# Patient Record
Sex: Female | Born: 1990 | Race: Black or African American | Hispanic: No | Marital: Married | Smoking: Former smoker
Health system: Southern US, Community
[De-identification: ages and names within clinical notes are randomized; demographics above are authoritative.]

## PROBLEM LIST (undated history)

## (undated) DIAGNOSIS — F32A Depression, unspecified: Secondary | ICD-10-CM

## (undated) DIAGNOSIS — T1490XA Injury, unspecified, initial encounter: Secondary | ICD-10-CM

## (undated) DIAGNOSIS — B977 Papillomavirus as the cause of diseases classified elsewhere: Secondary | ICD-10-CM

## (undated) DIAGNOSIS — F329 Major depressive disorder, single episode, unspecified: Secondary | ICD-10-CM

## (undated) DIAGNOSIS — F419 Anxiety disorder, unspecified: Secondary | ICD-10-CM

---

## 2012-01-23 ENCOUNTER — Emergency Department: Payer: Self-pay | Admitting: Emergency Medicine

## 2012-01-23 LAB — COMPREHENSIVE METABOLIC PANEL
Albumin: 4 g/dL (ref 3.8–5.6)
Alkaline Phosphatase: 67 U/L — ABNORMAL LOW (ref 82–169)
Anion Gap: 9 (ref 7–16)
Bilirubin,Total: 0.4 mg/dL (ref 0.2–1.0)
Calcium, Total: 8.9 mg/dL — ABNORMAL LOW (ref 9.0–10.7)
Chloride: 108 mmol/L — ABNORMAL HIGH (ref 98–107)
Co2: 25 mmol/L (ref 21–32)
EGFR (African American): >60
EGFR (Non-African Amer.): >60
Glucose: 88 mg/dL (ref 65–99)
Osmolality: 282 (ref 275–301)
SGPT (ALT): 20 U/L (ref 12–78)
Sodium: 142 mmol/L (ref 136–145)

## 2012-01-23 LAB — URINALYSIS, COMPLETE
Blood: NEGATIVE
Glucose,UR: NEGATIVE mg/dL (ref 0–75)
Leukocyte Esterase: NEGATIVE
Nitrite: NEGATIVE
Ph: 7 (ref 4.5–8.0)
Specific Gravity: 1.018 (ref 1.003–1.030)
WBC UR: 1 /HPF (ref 0–5)

## 2012-01-23 LAB — CBC
HCT: 39.1 % (ref 35.0–47.0)
HGB: 13.1 g/dL (ref 12.0–16.0)
MCH: 26.6 pg (ref 26.0–34.0)
MCHC: 33.4 g/dL (ref 32.0–36.0)
MCV: 80 fL (ref 80–100)
RDW: 16.8 % — ABNORMAL HIGH (ref 11.5–14.5)

## 2012-01-23 LAB — PREGNANCY, URINE: Pregnancy Test, Urine: NEGATIVE m[IU]/mL

## 2013-03-11 ENCOUNTER — Emergency Department: Payer: Self-pay | Admitting: Emergency Medicine

## 2013-03-11 LAB — RAPID INFLUENZA A&B ANTIGENS

## 2013-05-02 ENCOUNTER — Ambulatory Visit: Payer: Self-pay

## 2013-06-23 ENCOUNTER — Emergency Department: Payer: Self-pay | Admitting: Emergency Medicine

## 2013-06-23 LAB — URINALYSIS, COMPLETE
BILIRUBIN, UR: NEGATIVE
Bacteria: NONE SEEN
Blood: NEGATIVE
Glucose,UR: NEGATIVE mg/dL (ref 0–75)
KETONE: NEGATIVE
Leukocyte Esterase: NEGATIVE
Nitrite: NEGATIVE
PROTEIN: NEGATIVE
Ph: 8 (ref 4.5–8.0)
RBC,UR: <1 /HPF (ref 0–5)
Specific Gravity: 1.016 (ref 1.003–1.030)
WBC UR: <1 /HPF (ref 0–5)

## 2013-06-28 ENCOUNTER — Emergency Department: Payer: Self-pay | Admitting: Internal Medicine

## 2013-06-28 LAB — URINALYSIS, COMPLETE
BACTERIA: NONE SEEN
BLOOD: NEGATIVE
Bilirubin,UR: NEGATIVE
Glucose,UR: NEGATIVE mg/dL (ref 0–75)
LEUKOCYTE ESTERASE: NEGATIVE
Nitrite: NEGATIVE
Ph: 6 (ref 4.5–8.0)
Protein: 100
Specific Gravity: 1.034 (ref 1.003–1.030)

## 2013-06-28 LAB — COMPREHENSIVE METABOLIC PANEL
ALBUMIN: 3.9 g/dL (ref 3.4–5.0)
AST: 21 U/L (ref 15–37)
Alkaline Phosphatase: 61 U/L
Anion Gap: 8 (ref 7–16)
BILIRUBIN TOTAL: 0.5 mg/dL (ref 0.2–1.0)
BUN: 6 mg/dL — ABNORMAL LOW (ref 7–18)
CREATININE: 0.81 mg/dL (ref 0.60–1.30)
Calcium, Total: 8.8 mg/dL (ref 8.5–10.1)
Chloride: 104 mmol/L (ref 98–107)
Co2: 22 mmol/L (ref 21–32)
EGFR (African American): >60
GLUCOSE: 85 mg/dL (ref 65–99)
Osmolality: 265 (ref 275–301)
POTASSIUM: 3.2 mmol/L — AB (ref 3.5–5.1)
SGPT (ALT): 24 U/L (ref 12–78)
SODIUM: 134 mmol/L — AB (ref 136–145)
Total Protein: 7.7 g/dL (ref 6.4–8.2)

## 2013-06-28 LAB — CBC WITH DIFFERENTIAL/PLATELET
Basophil #: 0 10*3/uL (ref 0.0–0.1)
Basophil %: 0.2 %
EOS PCT: 0.3 %
Eosinophil #: 0 10*3/uL (ref 0.0–0.7)
HCT: 37 % (ref 35.0–47.0)
HGB: 11.9 g/dL — AB (ref 12.0–16.0)
Lymphocyte #: 2.7 10*3/uL (ref 1.0–3.6)
Lymphocyte %: 22.3 %
MCH: 25.6 pg — AB (ref 26.0–34.0)
MCHC: 32.2 g/dL (ref 32.0–36.0)
MCV: 79 fL — ABNORMAL LOW (ref 80–100)
MONO ABS: 1.1 x10 3/mm — AB (ref 0.2–0.9)
MONOS PCT: 9.5 %
Neutrophil #: 8.1 10*3/uL — ABNORMAL HIGH (ref 1.4–6.5)
Neutrophil %: 67.7 %
PLATELETS: 166 10*3/uL (ref 150–440)
RBC: 4.66 10*6/uL (ref 3.80–5.20)
RDW: 16.8 % — AB (ref 11.5–14.5)
WBC: 12 10*3/uL — ABNORMAL HIGH (ref 3.6–11.0)

## 2013-06-28 LAB — HCG, QUANTITATIVE, PREGNANCY: Beta Hcg, Quant.: 27858 m[IU]/mL — ABNORMAL HIGH

## 2013-07-19 ENCOUNTER — Ambulatory Visit: Payer: Self-pay | Admitting: Emergency Medicine

## 2013-07-19 LAB — URINALYSIS, COMPLETE
BACTERIA: NEGATIVE
Bilirubin,UR: NEGATIVE
Glucose,UR: NEGATIVE mg/dL (ref 0–75)
Nitrite: NEGATIVE
Ph: 8.5 (ref 4.5–8.0)
Specific Gravity: 1.005 (ref 1.003–1.030)

## 2013-07-19 LAB — CBC WITH DIFFERENTIAL/PLATELET
BASOS ABS: 0.1 10*3/uL (ref 0.0–0.1)
BASOS PCT: 1 %
EOS ABS: 0.3 10*3/uL (ref 0.0–0.7)
EOS PCT: 3.7 %
HCT: 38.1 % (ref 35.0–47.0)
HGB: 12.5 g/dL (ref 12.0–16.0)
LYMPHS ABS: 2.3 10*3/uL (ref 1.0–3.6)
Lymphocyte %: 31.8 %
MCH: 26.1 pg (ref 26.0–34.0)
MCHC: 32.8 g/dL (ref 32.0–36.0)
MCV: 80 fL (ref 80–100)
MONO ABS: 0.7 x10 3/mm (ref 0.2–0.9)
Monocyte %: 9.9 %
Neutrophil #: 3.8 10*3/uL (ref 1.4–6.5)
Neutrophil %: 53.6 %
Platelet: 200 10*3/uL (ref 150–440)
RBC: 4.79 10*6/uL (ref 3.80–5.20)
RDW: 16.2 % — AB (ref 11.5–14.5)
WBC: 7.1 10*3/uL (ref 3.6–11.0)

## 2013-07-19 LAB — GC/CHLAMYDIA PROBE AMP

## 2013-09-03 ENCOUNTER — Emergency Department: Payer: Self-pay | Admitting: Emergency Medicine

## 2013-10-11 ENCOUNTER — Observation Stay: Payer: Self-pay | Admitting: Obstetrics and Gynecology

## 2013-12-17 ENCOUNTER — Observation Stay: Payer: Self-pay

## 2013-12-17 LAB — URINALYSIS, COMPLETE
Bilirubin,UR: NEGATIVE
Blood: NEGATIVE
Glucose,UR: NEGATIVE mg/dL (ref 0–75)
Ketone: NEGATIVE
LEUKOCYTE ESTERASE: NEGATIVE
NITRITE: NEGATIVE
Ph: 6 (ref 4.5–8.0)
Protein: 30
SPECIFIC GRAVITY: 1.019 (ref 1.003–1.030)
Squamous Epithelial: 7
WBC UR: 1 /HPF (ref 0–5)

## 2014-01-03 ENCOUNTER — Ambulatory Visit: Payer: Self-pay

## 2014-04-23 ENCOUNTER — Emergency Department: Payer: Self-pay | Admitting: Emergency Medicine

## 2014-04-23 LAB — COMPREHENSIVE METABOLIC PANEL
ALBUMIN: 3.8 g/dL (ref 3.4–5.0)
ALK PHOS: 119 U/L — AB (ref 46–116)
AST: 28 U/L (ref 15–37)
Anion Gap: 8 (ref 7–16)
BUN: 10 mg/dL (ref 7–18)
Bilirubin,Total: 0.4 mg/dL (ref 0.2–1.0)
Calcium, Total: 9.3 mg/dL (ref 8.5–10.1)
Chloride: 105 mmol/L (ref 98–107)
Co2: 26 mmol/L (ref 21–32)
Creatinine: 1.06 mg/dL (ref 0.60–1.30)
EGFR (Non-African Amer.): >60
GLUCOSE: 114 mg/dL — AB (ref 65–99)
Osmolality: 277 (ref 275–301)
Potassium: 4 mmol/L (ref 3.5–5.1)
SGPT (ALT): 23 U/L (ref 14–63)
SODIUM: 139 mmol/L (ref 136–145)
Total Protein: 8.4 g/dL — ABNORMAL HIGH (ref 6.4–8.2)

## 2014-04-23 LAB — CBC WITH DIFFERENTIAL/PLATELET
Basophil #: 0 10*3/uL (ref 0.0–0.1)
Basophil %: 0.2 %
EOS ABS: 0.1 10*3/uL (ref 0.0–0.7)
Eosinophil %: 0.6 %
HCT: 38.8 % (ref 35.0–47.0)
HGB: 12.4 g/dL (ref 12.0–16.0)
LYMPHS ABS: 0.7 10*3/uL — AB (ref 1.0–3.6)
Lymphocyte %: 5.7 %
MCH: 25.1 pg — AB (ref 26.0–34.0)
MCHC: 31.9 g/dL — ABNORMAL LOW (ref 32.0–36.0)
MCV: 79 fL — AB (ref 80–100)
MONO ABS: 0.6 x10 3/mm (ref 0.2–0.9)
Monocyte %: 4.9 %
Neutrophil #: 10.6 10*3/uL — ABNORMAL HIGH (ref 1.4–6.5)
Neutrophil %: 88.6 %
PLATELETS: 241 10*3/uL (ref 150–440)
RBC: 4.94 10*6/uL (ref 3.80–5.20)
RDW: 17.4 % — AB (ref 11.5–14.5)
WBC: 12 10*3/uL — ABNORMAL HIGH (ref 3.6–11.0)

## 2014-04-23 LAB — URINALYSIS, COMPLETE
Bacteria: NONE SEEN
Bilirubin,UR: NEGATIVE
Blood: NEGATIVE
Glucose,UR: NEGATIVE mg/dL (ref 0–75)
Ketone: NEGATIVE
Leukocyte Esterase: NEGATIVE
Nitrite: NEGATIVE
PH: 6 (ref 4.5–8.0)
PROTEIN: NEGATIVE
RBC,UR: 1 /HPF (ref 0–5)
SPECIFIC GRAVITY: 1.021 (ref 1.003–1.030)
Squamous Epithelial: 3
WBC UR: 1 /HPF (ref 0–5)

## 2014-04-23 LAB — PREGNANCY, URINE: Pregnancy Test, Urine: NEGATIVE m[IU]/mL

## 2014-04-23 LAB — LIPASE, BLOOD: LIPASE: 127 U/L (ref 73–393)

## 2014-07-25 NOTE — H&P (Signed)
L&D Evaluation:  History:  HPI 24 year old G3P0110 at 2851w1d by stated EDC of 02/20/2014 presenting for evaluation of single episode of pink vaginal spotting.  No futher bleeding since that time, denies contractions, +FM, no LOF.  Patient states she has had an anatomy scan with no concerns expressed to her as to cervical length, or placental location.  Denies recent intercourse, vaginal checks, or TVUS preceeding this episode.   [facility] at [facility] in [place], thus far uncomplicated.  Her pregnancy history is noteable for prior 1st trimester pregnancy loss, followed by short interval pregnancy with 21 week loss.  No underlying cause was determined, patient spontaneously labored shortly after the IUFD was diagnosed.   Presents with vaginal bleeding   Patient's Medical History No Chronic Illness   Patient's Surgical History none   Medications Pre Natal Vitamins   Allergies NKDA   Social History none   Family History Non-Contributory   ROS:  ROS All systems were reviewed.  HEENT, CNS, GI, GU, Respiratory, CV, Renal and Musculoskeletal systems were found to be normal.   Exam:  Vital Signs stable   General no apparent distress   Mental Status no increased work of breathing   Abdomen gravid, non-tender   Estimated Fetal Weight Average for gestational age   Fetal Position Breech, anterior placenta well away from cervical os   Edema no edema   Pelvic no external lesions, cervix closed and thick   Mebranes Intact   FHT +FHT   Ucx absent   Impression:  Impression Vaginal spotting   Plan:  Comments 1) Vaginal spotting - no evidence of abruption, preterm labor or preterm cervical dilation      - reassurance and precautions given  2) Fetus - previable, confirmed FHTs good fetal movement on limited bedside ultrasound by myself  3) PNL - unavailable, patient lives in [place].  Should she represent to [facility] had her sign release of records particularly given her  complicated prior pregnancy history  4) Disposition - discharge home with follow up at her regular OB scheduled for 10/18/13   Follow Up Appointment already scheduled   Electronic Signatures: Lorrene ReidStaebler, Lavar Rosenzweig M (MD)  (Signed 28-Jul-15 21:43)  Authored: L&D Evaluation   Last Updated: 28-Jul-15 21:43 by Lorrene ReidStaebler, Sunya Humbarger M (MD)

## 2014-07-25 NOTE — H&P (Signed)
L&D Evaluation:  History:  HPI 24 year old G3P0110 at 530w5d by 227w1d US derived EDC of 02/20/2014 presenting for evaluation of sprapubic cramping.  Patient was at baby shower today in [place] today, has been in the car  alot.  [facility].  Drove back to [place] and then decided to come to [facility] for evalution of her symptoms.  Was just seen by her primary Ob/GYN yesterday.  No vaginal bleeding no LOF.  [facility]NC at [facility] in [place], thus far uncomplicated.  Her pregnancy history is noteable for prior 1st trimester pregnancy loss, followed by short interval pregnancy with 21 week loss.  No underlying cause was determined, patient spontaneously labored shortly after the missed abortion was diagnosed.  She presents here today because she states it was more convenient.   Presents with vaginal bleeding   Patient's Medical History Depression previously on prozac prior to pregnancy   Patient's Surgical History none   Medications Pre Natal Vitamins   Allergies NKDA   Social History none   Family History Non-Contributory   ROS:  ROS All systems were reviewed.  HEENT, CNS, GI, GU, Respiratory, CV, Renal and Musculoskeletal systems were found to be normal.   Exam:  Vital Signs stable   General no apparent distress   Mental Status no increased work of breathing   Abdomen gravid, non-tender   Estimated Fetal Weight Average for gestational age    Edema no edema   Pelvic no external lesions   FHT reactive NST   Ucx absent   Plan:  Comments 1) Cramping - no evidence of UTI, no evidence of contractions, reassurance  2) Fetus - reactive NST by <32 week criteria  3) PNL - O pos / ABSC neg / Hgb AA / RPR NR / RI / HBsAg neg / GC & CT neg & neg MSAFP neg   4) Disposition - last seen by primary OB/GYN on 10/2 to arrange sooner follow up if needed.  I discussed should the patient present here in preterm labor or labor we would not transfer her and she would  need to [facility].   Follow Up Appointment already scheduled   Electronic Signatures: Lorrene ReidStaebler, Frankee Gritz M (MD)  (Signed 03-Oct-15 22:17)  Authored: L&D Evaluation   Last Updated: 03-Oct-15 22:17 by Lorrene ReidStaebler, Kellene Mccleary M (MD)

## 2014-08-28 ENCOUNTER — Encounter: Payer: Self-pay | Admitting: Emergency Medicine

## 2014-08-28 ENCOUNTER — Ambulatory Visit
Admission: EM | Admit: 2014-08-28 | Discharge: 2014-08-28 | Disposition: A | Discharge status: Home / Self Care | Payer: Medicaid Other | Attending: Family Medicine | Admitting: Family Medicine

## 2014-08-28 DIAGNOSIS — R109 Unspecified abdominal pain: Secondary | ICD-10-CM | POA: Insufficient documentation

## 2014-08-28 DIAGNOSIS — R1030 Lower abdominal pain, unspecified: Secondary | ICD-10-CM

## 2014-08-28 DIAGNOSIS — R110 Nausea: Secondary | ICD-10-CM

## 2014-08-28 DIAGNOSIS — Z87891 Personal history of nicotine dependence: Secondary | ICD-10-CM | POA: Insufficient documentation

## 2014-08-28 LAB — URINALYSIS COMPLETE WITH MICROSCOPIC (ARMC ONLY)
GLUCOSE, UA: NEGATIVE mg/dL
HGB URINE DIPSTICK: NEGATIVE
Nitrite: NEGATIVE
PROTEIN: 30 mg/dL — AB
SPECIFIC GRAVITY, URINE: 1.02 (ref 1.005–1.030)
pH: 3 — ABNORMAL LOW (ref 5.0–8.0)

## 2014-08-28 LAB — CHLAMYDIA/NGC RT PCR (ARMC ONLY)
Chlamydia Tr: DETECTED
N gonorrhoeae: DETECTED

## 2014-08-28 MED ORDER — AZITHROMYCIN 500 MG PO TABS
1000.0000 mg | ORAL_TABLET | Freq: Once | ORAL | Status: AC
  Administered 2014-08-28: 1000 mg via ORAL

## 2014-08-28 MED ORDER — PROMETHAZINE HCL 25 MG/ML IJ SOLN
25.0000 mg | Freq: Once | INTRAMUSCULAR | Status: AC
  Administered 2014-08-28: 25 mg via INTRAMUSCULAR

## 2014-08-28 MED ORDER — ONDANSETRON 8 MG PO TBDP: 8.0000 mg | ORAL_TABLET | Freq: Two times a day (BID) | ORAL | Status: DC

## 2014-08-28 MED ORDER — ONDANSETRON 8 MG PO TBDP
8.0000 mg | ORAL_TABLET | Freq: Once | ORAL | Status: AC
  Administered 2014-08-28: 8 mg via ORAL

## 2014-08-28 NOTE — Discharge Instructions (Signed)
Recommend clear liquid diet and advance slowly as tolerated Over the counter zantac 75mg  2 tablets twice daily rx zofran as needed for nausea Follow up as needed if symptoms worsen or are not improving

## 2014-08-28 NOTE — ED Provider Notes (Signed)
CSN: [id number]     Arrival date & time 08/28/14  1022 History   First MD Initiated Contact with Patient 08/28/14 1109     Chief Complaint  Patient presents with  . Abdominal Pain   HPI   Patient presents with nausea, emesis 1, feeling off balance, tactile temps. She had the onset of low midline crampy abdominal discomfort yesterday. Some change in voiding, with small volumes and dribbling, onset yesterday. No vaginal discharge, just finished a menstrual period that was a little heavier and a little crampier than usual, but also shorter; started June 7. Patient is uncertain when last bowel movement was, has them infrequently. She is 6 months postpartum. She denies any chance of pregnancy, and is taking an oral contraceptive.  History reviewed. No pertinent past medical history. History reviewed. No pertinent past surgical history. History reviewed. No pertinent family history. History  Substance Use Topics  . Smoking status: Former Games developer  . Smokeless tobacco: Never Used  . Alcohol Use: No   OB History    No data available     Review of Systems  All other systems reviewed and are negative.   Allergies  Review of patient's allergies indicates no known allergies.  Home Medications   Prior to Admission medications   Medication Sig Start Date End Date Taking? Authorizing Provider  Norethindrone (CAMILA PO) Take 1 tablet by mouth daily.   Yes Historical Provider, MD   BP 102/61 mmHg  Pulse 98  Temp(Src) 99.5 F (37.5 C) (Oral)  Resp 18  Ht 5\' 3"  (1.6 m)  Wt 169 lb (76.658 kg)  BMI 29.94 kg/m2  SpO2 100%  LMP 08/22/2014 Physical Exam  Constitutional: She is oriented to person, place, and time.  Alert, nicely groomed Mild distress: Looks ill but not toxic. Huddled under blankets in the chair.  HENT:  Head: Atraumatic.  Eyes:  Conjugate gaze, no eye redness/drainage  Neck: Neck supple.  Cardiovascular: Normal rate and regular rhythm.   Pulmonary/Chest: No respiratory  distress. She has no wheezes. She has no rales.  Lungs clear, symmetric breath sounds  Abdominal: She exhibits no distension. There is no rebound and no guarding.  Mild suprapubic tenderness to deep palpation  Musculoskeletal: Normal range of motion.  No leg swelling No CVAT  Neurological: She is alert and oriented to person, place, and time.  Skin: Skin is warm and dry.  No cyanosis  Nursing note and vitals reviewed.   ED Course  Procedures (including critical care time) Labs Review Labs Reviewed  URINE CULTURE  URINALYSIS COMPLETEWITH MICROSCOPIC ([facility] ONLY)   UA unremarkable; urine cx pending.  Imaging Review No results found.   Zofran 8 mg by mouth given at the urgent care for nausea, to facilitate drinking fluids so that the patient could give a urine sample.  Orthostatic VS for the past 24 hrs:  BP- Lying Pulse- Lying BP- Sitting Pulse- Sitting BP- Standing at 0 minutes Pulse- Standing at 0 minutes  08/28/14 1157 121/63 mmHg 96 111/69 mmHg 103 116/70 mmHg 113      MDM  No diagnosis found.  Patient with low midline abd discomfort since yesterday, benign UA.  Differential dx includes pelvic infectious process, gastrointestinal process.  Signing care over to Dr Judd Gaudier.  Eustace Moore, MD 08/28/14 [phone]

## 2014-08-28 NOTE — ED Provider Notes (Signed)
CSN: [id number]     Arrival date & time 08/28/14  1022 History   First MD Initiated Contact with Patient 08/28/14 1109     Chief Complaint  Patient presents with  . Abdominal Pain   (Consider location/radiation/quality/duration/timing/severity/associated sxs/prior Treatment) Patient is a 24 y.o. female presenting with abdominal pain. The history is provided by the patient.  Abdominal Pain Pain location: mid lower abdomen. Pain quality: aching   Pain radiates to:  Does not radiate Pain severity:  Mild Onset quality:  Sudden Duration:  1 day Timing:  Constant Chronicity:  New Context: not alcohol use and not awakening from sleep   Relieved by:  Nothing Associated symptoms: nausea   Associated symptoms: no anorexia, no belching, no chest pain, no chills, no constipation, no cough, no diarrhea, no dysuria, no fatigue, no fever, no flatus, no hematemesis, no hematochezia, no hematuria, no melena, no shortness of breath, no sore throat, no vaginal bleeding, no vaginal discharge and no vomiting   Associated symptoms comment:  Sweats; constipation (no bowel movement for the last couple of days) Risk factors: no alcohol abuse, no aspirin use, not elderly, has not had multiple surgeries, no NSAID use, not pregnant and no recent hospitalization     History reviewed. No pertinent past medical history. History reviewed. No pertinent past surgical history. History reviewed. No pertinent family history. History  Substance Use Topics  . Smoking status: Former Games developer  . Smokeless tobacco: Never Used  . Alcohol Use: No   OB History    No data available     Review of Systems  Constitutional: Negative for fever, chills and fatigue.  HENT: Negative for sore throat.   Respiratory: Negative for cough and shortness of breath.   Cardiovascular: Negative for chest pain.  Gastrointestinal: Positive for nausea and abdominal pain. Negative for vomiting, diarrhea, constipation, melena, hematochezia,  anorexia, flatus and hematemesis.  Genitourinary: Negative for dysuria, hematuria, vaginal bleeding and vaginal discharge.    Allergies  Review of patient's allergies indicates no known allergies.  Home Medications   Prior to Admission medications   Medication Sig Start Date End Date Taking? Authorizing Provider  Norethindrone (CAMILA PO) Take 1 tablet by mouth daily.   Yes Historical Provider, MD  ondansetron (ZOFRAN ODT) 8 MG disintegrating tablet Take 1 tablet (8 mg total) by mouth 2 (two) times daily. 08/28/14   Payton Mccallum, MD   BP 102/61 mmHg  Pulse 98  Temp(Src) 99.5 F (37.5 C) (Oral)  Resp 18  Ht 5\' 3"  (1.6 m)  Wt 169 lb (76.658 kg)  BMI 29.94 kg/m2  SpO2 100%  LMP 08/22/2014 Physical Exam  Constitutional: She appears well-developed and well-nourished. No distress.  HENT:  Head: Normocephalic and atraumatic.  Cardiovascular: Normal rate and normal heart sounds.   Pulmonary/Chest: Effort normal and breath sounds normal. No respiratory distress. She has no wheezes. She has no rales. She exhibits no tenderness.  Abdominal: Soft. Bowel sounds are normal. She exhibits no distension and no mass. There is tenderness (mild mid lower abdomen; no rebound or guarding). There is no rebound and no guarding.  Neurological: She is alert.  Skin: No rash noted. She is not diaphoretic.  Vitals reviewed.   ED Course  Procedures (including critical care time) Labs Review Labs Reviewed  URINALYSIS COMPLETEWITH MICROSCOPIC ([facility] ONLY) - Abnormal; Notable for the following:    Color, Urine AMBER (*)    Bilirubin Urine TRACE (*)    Ketones, ur 2+ (*)    pH 3.0 (*)  Protein, ur 30 (*)    Leukocytes, UA 3+ (*)    Bacteria, UA FEW (*)    Squamous Epithelial / LPF 0-5 (*)    All other components within normal limits  URINE CULTURE  CHLAMYDIA/NGC RT PCR ([facility] ONLY)    Imaging Review No results found.   MDM    1. Lower abdominal pain   2. Nausea   (likely viral  syndrome; gastroenteritis)   New Prescriptions   ONDANSETRON (ZOFRAN ODT) 8 MG DISINTEGRATING TABLET    Take 1 tablet (8 mg total) by mouth 2 (two) times daily.   Plan: 1. Test results and diagnosis reviewed with patient 2. rx as per orders; risks, benefits, potential side effects reviewed with patient 3. Recommend supportive treatment with clear liquid diet for next 24 hours, then advance slowly as tolerated 4. Check urine culture and urine GC/chlamydia; patient will be notified of results/any further recommendations based on results 5. Patient given empiric treatment with zithromax 1gm po x1 5. F/u prn if symptoms worsen or don't improve    Payton Mccallum, MD 08/28/14 1349

## 2014-08-28 NOTE — ED Notes (Signed)
Upon discharging the patient she became extremely nauseated with dry heaves. Patient's grandmother is driving. Order for Phenergen 25 mg IM obtained and adm.

## 2014-08-28 NOTE — ED Notes (Signed)
Low abdominal pain that started yesterday. Has not had a bowel movement since pain started. Also having nausea, feeling cold, and night sweats. Has not checked her temp.

## 2014-08-29 ENCOUNTER — Ambulatory Visit
Admission: EM | Admit: 2014-08-29 | Discharge: 2014-08-29 | Disposition: A | Discharge status: Home / Self Care | Payer: Medicaid Other | Attending: Family Medicine | Admitting: Family Medicine

## 2014-08-29 ENCOUNTER — Telehealth: Payer: Self-pay | Admitting: Emergency Medicine

## 2014-08-29 DIAGNOSIS — A5400 Gonococcal infection of lower genitourinary tract, unspecified: Secondary | ICD-10-CM

## 2014-08-29 MED ORDER — CEFTRIAXONE SODIUM 250 MG IJ SOLR
250.0000 mg | Freq: Once | INTRAMUSCULAR | Status: AC
  Administered 2014-08-29: 250 mg via INTRAMUSCULAR

## 2014-08-29 NOTE — ED Notes (Signed)
Patient was notified that her test results came back positive for both Gonorrhea and Chlamydia.  Patient was instructed that she would need to come back to [facility] to receive an injection of Rocephin to treat her for the Gonorrhea.  Patient was already given 1000mg  of Azithromycin at her visit yesterday but did not receive Rochepin.  Patient verbalized understanding.  Patient states she will return to [facility] today for this.  The Communicable Disease form was faxed to the [employer] Department and received confirmation that the fax went through.

## 2014-08-29 NOTE — ED Notes (Addendum)
Patient here for nurse visit.  Patient was notified to come in today to receive a Rochephin injection for her positive result of Gonorrhea.  Patient states that she is feeling better from yesterday.

## 2014-08-30 LAB — URINE CULTURE

## 2016-04-20 ENCOUNTER — Ambulatory Visit: Payer: Medicaid Other

## 2016-04-20 ENCOUNTER — Ambulatory Visit
Admission: EM | Admit: 2016-04-20 | Discharge: 2016-04-20 | Disposition: A | Discharge status: Home / Self Care | Payer: Medicaid Other | Attending: Emergency Medicine | Admitting: Emergency Medicine

## 2016-04-20 DIAGNOSIS — M25532 Pain in left wrist: Secondary | ICD-10-CM | POA: Diagnosis not present

## 2016-04-20 DIAGNOSIS — S63502A Unspecified sprain of left wrist, initial encounter: Secondary | ICD-10-CM | POA: Diagnosis not present

## 2016-04-20 DIAGNOSIS — Z87891 Personal history of nicotine dependence: Secondary | ICD-10-CM | POA: Insufficient documentation

## 2016-04-20 MED ORDER — NAPROXEN 500 MG PO TABS: 500.0000 mg | ORAL_TABLET | Freq: Two times a day (BID) | ORAL | 0 refills | Status: DC

## 2016-04-20 NOTE — ED Triage Notes (Signed)
Patient complains of left wrist pain. Patient states that she has had no known injury to the area. Patient states that she has pain with any movement. Patient thought she was improving but has been getting worse.

## 2016-04-20 NOTE — ED Provider Notes (Signed)
CSN: [id number]     Arrival date & time 04/20/16  1451 History   First MD Initiated Contact with Patient 04/20/16 1614     Chief Complaint  Patient presents with  . Wrist Pain   (Consider location/radiation/quality/duration/timing/severity/associated sxs/prior Treatment) HPI  26 year old female who presents with left wrist pain. 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 Is a most her pain is of her volar wrist actually over the flexor tendons have pain radiated into her third fourth and fifth fingers. Any motion of her wrist is painful. Has been using a neoprene went but has not found it to be helpful. Is no pain in the elbow or shoulder.       History reviewed. No pertinent past medical history. History reviewed. No pertinent surgical history. History reviewed. No pertinent family history. Social History  Substance Use Topics  . Smoking status: Former Games developer  . Smokeless tobacco: Never Used  . Alcohol use No   OB History    No data available     Review of Systems  Constitutional: Positive for activity change.  Musculoskeletal: Positive for arthralgias and myalgias.  All other systems reviewed and are negative.   Allergies  Patient has no known allergies.  Home Medications   Prior to Admission medications   Medication Sig Start Date End Date Taking? Authorizing Provider  naproxen (NAPROSYN) 500 MG tablet Take 1 tablet (500 mg total) by mouth 2 (two) times daily with a meal. 04/20/16   Lutricia Feil, PA-C   Meds Ordered and Administered this Visit  Medications - No data to display  BP 116/63 (BP Location: Left Arm)   Pulse 71   Temp 98 F (36.7 C) (Oral)   Resp 18   Ht 5\' 3"  (1.6 m)   Wt 170 lb (77.1 kg)   SpO2 100%   BMI 30.11 kg/m  No data found.   Physical Exam  Constitutional: She is oriented to person, place, and time. She appears well-developed and  well-nourished. No distress.  HENT:  Head: Normocephalic and atraumatic.  Eyes: EOM are normal. Pupils are equal, round, and reactive to light. Right eye exhibits no discharge. Left eye exhibits no discharge.  Neck: Normal range of motion. Neck supple.  Musculoskeletal: She exhibits tenderness.  Examination of the nondominant left hand and wrist shows [place] motion of 10 to flexion tension lacks 10. Pronation and supination are full. Fingerss move well. The tendon show good pull-through FDS and FDP are strong. Extensor tendons are also strong and show good excursion. She is tender over the flexor tendons at the wrist. No swelling ecchymosis or erythema are present.  Neurological: She is alert and oriented to person, place, and time.  Skin: Skin is warm and dry. She is not diaphoretic.  Psychiatric: She has a normal mood and affect. Her behavior is normal. Judgment and thought content normal.  Nursing note and vitals reviewed.   Urgent Care Course     Procedures (including critical care time)  Labs Review Labs Reviewed - No data to display  Imaging Review Dg Wrist Complete Left  Result Date: 04/20/2016 CLINICAL DATA:  Volar left wrist pain for 10 days with no known injury. EXAM: LEFT WRIST - COMPLETE 3+ VIEW COMPARISON:  None. FINDINGS: There is no evidence of fracture or dislocation. There is no evidence of arthropathy or other focal bone abnormality. Soft tissues are unremarkable. IMPRESSION:  Negative. Electronically Signed   By: Marnee SpringJonathon  Watts M.D.   On: 04/20/2016 16:48     Visual Acuity Review  Right Eye Distance:   Left Eye Distance:   Bilateral Distance:    Right Eye Near:   Left Eye Near:    Bilateral Near:     A Velcro wrist splint was applied to the left wrist    MDM   1. Sprain of left wrist, initial encounter    Discharge Medication List as of 04/20/2016  5:02 PM    START taking these medications   Details  naproxen (NAPROSYN) 500 MG tablet Take 1 tablet (500  mg total) by mouth 2 (two) times daily with a meal., Starting Sun 04/20/2016, Print      Plan: 1. Test/x-ray results and diagnosis reviewed with patient 2. rx as per orders; risks, benefits, potential side effects reviewed with patient 3. Recommend supportive treatment with Rest and symptom avoidance. She should wear Wrist splint full time for 2 weeks and at nighttime for one month. Take Naprosyn daily with food. If she is not improving she should follow-up with an orthopedic surgeon for further evaluation and treatment 4. F/u prn if symptoms worsen or don't improve     Lutricia FeilWilliam P Deysha Cartier, PA-C 04/20/16 1711

## 2016-07-20 ENCOUNTER — Ambulatory Visit
Admission: EM | Admit: 2016-07-20 | Discharge: 2016-07-20 | Disposition: A | Discharge status: Home / Self Care | Payer: Medicaid Other | Attending: Family Medicine | Admitting: Family Medicine

## 2016-07-20 ENCOUNTER — Encounter: Payer: Self-pay | Admitting: *Deleted

## 2016-07-20 ENCOUNTER — Ambulatory Visit: Payer: Medicaid Other

## 2016-07-20 DIAGNOSIS — F17210 Nicotine dependence, cigarettes, uncomplicated: Secondary | ICD-10-CM | POA: Diagnosis not present

## 2016-07-20 DIAGNOSIS — R079 Chest pain, unspecified: Secondary | ICD-10-CM | POA: Diagnosis present

## 2016-07-20 DIAGNOSIS — Z79899 Other long term (current) drug therapy: Secondary | ICD-10-CM | POA: Insufficient documentation

## 2016-07-20 DIAGNOSIS — M940 Chondrocostal junction syndrome [Tietze]: Secondary | ICD-10-CM | POA: Insufficient documentation

## 2016-07-20 DIAGNOSIS — R0602 Shortness of breath: Secondary | ICD-10-CM | POA: Diagnosis not present

## 2016-07-20 DIAGNOSIS — R0789 Other chest pain: Secondary | ICD-10-CM | POA: Insufficient documentation

## 2016-07-20 DIAGNOSIS — R110 Nausea: Secondary | ICD-10-CM | POA: Insufficient documentation

## 2016-07-20 LAB — COMPREHENSIVE METABOLIC PANEL
ALT: 10 U/L — AB (ref 14–54)
AST: 16 U/L (ref 15–41)
Albumin: 4.1 g/dL (ref 3.5–5.0)
Alkaline Phosphatase: 65 U/L (ref 38–126)
Anion gap: 5 (ref 5–15)
BILIRUBIN TOTAL: 0.6 mg/dL (ref 0.3–1.2)
BUN: 6 mg/dL (ref 6–20)
CHLORIDE: 107 mmol/L (ref 101–111)
CO2: 25 mmol/L (ref 22–32)
CREATININE: 0.93 mg/dL (ref 0.44–1.00)
Calcium: 9.4 mg/dL (ref 8.9–10.3)
GFR calc Af Amer: >60 mL/min (ref >60)
GFR calc non Af Amer: >60 mL/min (ref >60)
Glucose, Bld: 98 mg/dL (ref 65–99)
Potassium: 3.8 mmol/L (ref 3.5–5.1)
Sodium: 137 mmol/L (ref 135–145)
Total Protein: 7.5 g/dL (ref 6.5–8.1)

## 2016-07-20 LAB — CBC WITH DIFFERENTIAL/PLATELET
Basophils Absolute: 0.1 10*3/uL (ref 0–0.1)
Basophils Relative: 1 %
Eosinophils Absolute: 0.4 10*3/uL (ref 0–0.7)
Eosinophils Relative: 5 %
HEMATOCRIT: 41.1 % (ref 35.0–47.0)
HEMOGLOBIN: 13.4 g/dL (ref 12.0–16.0)
LYMPHS ABS: 3.1 10*3/uL (ref 1.0–3.6)
LYMPHS PCT: 35 %
MCH: 25.4 pg — ABNORMAL LOW (ref 26.0–34.0)
MCHC: 32.6 g/dL (ref 32.0–36.0)
MCV: 77.9 fL — AB (ref 80.0–100.0)
Monocytes Absolute: 0.9 10*3/uL (ref 0.2–0.9)
Monocytes Relative: 10 %
Neutro Abs: 4.3 10*3/uL (ref 1.4–6.5)
Neutrophils Relative %: 49 %
Platelets: 186 10*3/uL (ref 150–440)
RBC: 5.28 MIL/uL — AB (ref 3.80–5.20)
RDW: 18.4 % — ABNORMAL HIGH (ref 11.5–14.5)
WBC: 8.8 10*3/uL (ref 3.6–11.0)

## 2016-07-20 LAB — FIBRIN DERIVATIVES D-DIMER (ARMC ONLY): Fibrin derivatives D-dimer (ARMC): 254.89 (ref 0.00–499.00)

## 2016-07-20 LAB — TROPONIN I: Troponin I: <0.03 ng/mL (ref <0.03)

## 2016-07-20 LAB — CK: Total CK: 108 U/L (ref 38–234)

## 2016-07-20 LAB — PREGNANCY, URINE: Preg Test, Ur: NEGATIVE

## 2016-07-20 LAB — CKMB (ARMC ONLY): CK, MB: 0.6 ng/mL (ref 0.5–5.0)

## 2016-07-20 MED ORDER — MELOXICAM 15 MG PO TABS
15.0000 mg | ORAL_TABLET | Freq: Every day | ORAL | 0 refills | Status: DC
Start: 2016-07-20 — End: 2016-09-06

## 2016-07-20 MED ORDER — KETOROLAC TROMETHAMINE 60 MG/2ML IM SOLN
60.0000 mg | Freq: Once | INTRAMUSCULAR | Status: AC
  Administered 2016-07-20: 60 mg via INTRAMUSCULAR

## 2016-07-20 MED ORDER — ONDANSETRON 8 MG PO TBDP: 8.0000 mg | ORAL_TABLET | Freq: Three times a day (TID) | ORAL | 0 refills | Status: DC | PRN

## 2016-07-20 MED ORDER — ONDANSETRON 8 MG PO TBDP
8.0000 mg | ORAL_TABLET | Freq: Once | ORAL | Status: AC
  Administered 2016-07-20: 8 mg via ORAL

## 2016-07-20 NOTE — ED Provider Notes (Signed)
[facility]    CSN: [id number] Arrival date & time: 07/20/16  1354     History   Chief Complaint Chief Complaint  Patient presents with  . Chest Pain  . Nausea    HPI Paula Green is a 26 y.o. female.   Patient is a 26 year old Afro-American female who started having chest pain Friday night. The chest pain started Friday night was after she experienced an episode of nausea and vomiting at work. Since then she's had progressive pain in her chest on the right side of her chest next to the sternum that radiates to her back. She's never had a stress test and EKG before. She she does smoke. There is no history of significant coronary artery disease in the family no sudden death history no history of hypertension. No previous surgeries or operations no known drug allergies. Unable to state equivocally that she is not pregnant. She still having some nausea and   The history is provided by the patient and a significant other. No language interpreter was used.  Chest Pain  Pain location:  Substernal area, R chest and R lateral chest Pain quality: aching, radiating and shooting   Pain radiates to:  Upper back Pain severity:  Moderate Onset quality:  Sudden Timing:  Constant Progression:  Worsening Chronicity:  New Context: lifting and movement   Relieved by:  Nothing Worsened by:  Coughing, deep breathing, movement and exertion Ineffective treatments:  None tried Associated symptoms: nausea, shortness of breath and vomiting     History reviewed. No pertinent past medical history.  There are no active problems to display for this patient.   History reviewed. No pertinent surgical history.  OB History    No data available       Home Medications    Prior to Admission medications   Medication Sig Start Date End Date Taking? Authorizing Provider  naproxen (NAPROSYN) 500 MG tablet Take 1 tablet (500 mg total) by mouth 2 (two) times daily with a meal. 04/20/16    Lutricia Feil, PA-C    Family History Family History  Problem Relation Age of Onset  . Cancer Mother     Social History Social History  Substance Use Topics  . Smoking status: Current Every Day Smoker    Packs/day: 0.50    Types: Cigarettes  . Smokeless tobacco: Never Used  . Alcohol use No     Allergies   Latex   Review of Systems Review of Systems  Respiratory: Positive for chest tightness and shortness of breath.   Cardiovascular: Positive for chest pain.  Gastrointestinal: Positive for nausea and vomiting.  All other systems reviewed and are negative.    Physical Exam Triage Vital Signs ED Triage Vitals  Enc Vitals Group     BP 07/20/16 1402 (!) 103/50     Pulse Rate 07/20/16 1402 65     Resp 07/20/16 1402 16     Temp 07/20/16 1402 98.5 F (36.9 C)     Temp Source 07/20/16 1402 Oral     SpO2 07/20/16 1402 100 %     Weight 07/20/16 1404 170 lb (77.1 kg)     Height 07/20/16 1404 5\' 3"  (1.6 m)     Head Circumference --      Peak Flow --      Pain Score 07/20/16 1405 6     Pain Loc --      Pain Edu? --      Excl. in GC? --  No data found.   Updated Vital Signs BP (!) 103/50 (BP Location: Right Arm)   Pulse 65   Temp 98.5 F (36.9 C) (Oral)   Resp 16   Ht 5\' 3"  (1.6 m)   Wt 170 lb (77.1 kg)   SpO2 100%   BMI 30.11 kg/m   Visual Acuity Right Eye Distance:   Left Eye Distance:   Bilateral Distance:    Right Eye Near:   Left Eye Near:    Bilateral Near:     Physical Exam  Constitutional: She is oriented to person, place, and time. She appears well-developed.  HENT:  Head: Normocephalic and atraumatic.  Right Ear: External ear normal.  Left Ear: External ear normal.  Nose: Nose normal.  Mouth/Throat: Oropharynx is clear and moist.  Eyes: Conjunctivae are normal. Pupils are equal, round, and reactive to light.  Neck: Normal range of motion. Neck supple. No tracheal deviation present. No thyromegaly present.  Cardiovascular: Normal  rate, regular rhythm and normal heart sounds.   Pulmonary/Chest: Effort normal and breath sounds normal. She exhibits tenderness. She exhibits no mass and no swelling. Bony tenderness: PALPATING THIS AREA DOES PRODUCE DISCOMFORT THAT SHE FELT.    Most the pain is over the anterior right chest wall lateral to the sternum  Abdominal: Soft. Bowel sounds are normal. She exhibits no distension.  Musculoskeletal: Normal range of motion. She exhibits no edema.  Lymphadenopathy:    She has no cervical adenopathy.  Neurological: She is alert and oriented to person, place, and time.  Skin: Skin is warm.  Psychiatric: She has a normal mood and affect.  Vitals reviewed.    UC Treatments / Results  Labs (all labs ordered are listed, but only abnormal results are displayed) Labs Reviewed  CBC WITH DIFFERENTIAL/PLATELET  TROPONIN I  COMPREHENSIVE METABOLIC PANEL  PREGNANCY, URINE  CK  CKMB([facility] ONLY)  FIBRIN DERIVATIVES D-DIMER ([facility] ONLY)    EKG  EKG Interpretation None      ED ECG REPORT I, Arcadio Cope H, the attending physician, personally viewed and interpreted this ECG.   Date: 07/20/2016  EKG Time: 14:13:32  Rate:61 Rhythm : There are no previous tracings available for comparison, normal sinus rhythm, Borderline right ventricular conduction delay  Axis:7  Intervals:nonspecific intraventricular conduction delay  ST&T Change: None Radiology No results found.  Procedures Procedures (including critical care time)  Medications Ordered in UC Medications  ondansetron (ZOFRAN-ODT) disintegrating tablet 8 mg (not administered)  ketorolac (TORADOL) injection 60 mg (not administered)      Results for orders placed or performed during the hospital encounter of 07/20/16  CBC with Differential  Result Value Ref Range   WBC 8.8 3.6 - 11.0 K/uL   RBC 5.28 (H) 3.80 - 5.20 MIL/uL   Hemoglobin 13.4 12.0 - 16.0 g/dL   HCT 86.541.1 78.435.0 - 69.647.0 %   MCV 77.9 (L) 80.0 - 100.0 fL   MCH  25.4 (L) 26.0 - 34.0 pg   MCHC 32.6 32.0 - 36.0 g/dL   RDW 29.518.4 (H) 28.411.5 - 13.214.5 %   Platelets 186 150 - 440 K/uL   Neutrophils Relative % 49 %   Neutro Abs 4.3 1.4 - 6.5 K/uL   Lymphocytes Relative 35 %   Lymphs Abs 3.1 1.0 - 3.6 K/uL   Monocytes Relative 10 %   Monocytes Absolute 0.9 0.2 - 0.9 K/uL   Eosinophils Relative 5 %   Eosinophils Absolute 0.4 0 - 0.7 K/uL   Basophils Relative 1 %  Basophils Absolute 0.1 0 - 0.1 K/uL  Troponin I  Result Value Ref Range   Troponin I <0.03 <0.03 ng/mL  Comprehensive metabolic panel  Result Value Ref Range   Sodium 137 135 - 145 mmol/L   Potassium 3.8 3.5 - 5.1 mmol/L   Chloride 107 101 - 111 mmol/L   CO2 25 22 - 32 mmol/L   Glucose, Bld 98 65 - 99 mg/dL   BUN 6 6 - 20 mg/dL   Creatinine, Ser 1.61 0.44 - 1.00 mg/dL   Calcium 9.4 8.9 - 09.6 mg/dL   Total Protein 7.5 6.5 - 8.1 g/dL   Albumin 4.1 3.5 - 5.0 g/dL   AST 16 15 - 41 U/L   ALT 10 (L) 14 - 54 U/L   Alkaline Phosphatase 65 38 - 126 U/L   Total Bilirubin 0.6 0.3 - 1.2 mg/dL   GFR calc non Af Amer >60 >60 mL/min   GFR calc Af Amer >60 >60 mL/min   Anion gap 5 5 - 15  Pregnancy, urine  Result Value Ref Range   Preg Test, Ur NEGATIVE NEGATIVE  CK  Result Value Ref Range   Total CK 108 38 - 234 U/L  CKMB([facility] only)  Result Value Ref Range   CK, MB 0.6 0.5 - 5.0 ng/mL  Fibrin derivatives D-Dimer  Result Value Ref Range   Fibrin derivatives D-dimer ([facility]) 254.89 0.00 - 499.00   Initial Impression / Assessment and Plan / UC Course  I have reviewed the triage vital signs and the nursing notes.  Pertinent labs & imaging results that were available during my care of the patient were reviewed by me and considered in my medical decision making (see chart for details).   it should be noted Toradol injection did help the pain. We'll place her on Mobic 13 mg 1 tablet day Zofran for nausea no for work for tomorrow follow-up PCP as needed the patient informed that she did have a  nonspecific intraventricular conduction delay on her EKG.   Final Clinical Impressions(s) / UC Diagnoses   Final diagnoses:  Nausea  Chest wall pain  Costal chondritis    New Prescriptions New Prescriptions   No medications on file     Note: This dictation was prepared with Dragon dictation along with smaller phrase technology. Any transcriptional errors that result from this process are unintentional.   Hassan Rowan, MD 07/20/16 [phone]

## 2016-07-20 NOTE — ED Triage Notes (Signed)
Patient started having right side chest pain with nausea and vomiting 2 days ago. Now previous history of chest pain.

## 2016-09-06 ENCOUNTER — Ambulatory Visit
Admission: EM | Admit: 2016-09-06 | Discharge: 2016-09-06 | Disposition: A | Discharge status: Home / Self Care | Payer: Medicaid Other

## 2016-09-06 ENCOUNTER — Encounter: Payer: Self-pay | Admitting: Gynecology

## 2016-09-06 DIAGNOSIS — F419 Anxiety disorder, unspecified: Secondary | ICD-10-CM

## 2016-09-06 DIAGNOSIS — R110 Nausea: Secondary | ICD-10-CM | POA: Diagnosis not present

## 2016-09-06 DIAGNOSIS — F332 Major depressive disorder, recurrent severe without psychotic features: Secondary | ICD-10-CM

## 2016-09-06 HISTORY — DX: Papillomavirus as the cause of diseases classified elsewhere: B97.7

## 2016-09-06 HISTORY — DX: Anxiety disorder, unspecified: F41.9

## 2016-09-06 HISTORY — DX: Depression, unspecified: F32.A

## 2016-09-06 HISTORY — DX: Injury, unspecified, initial encounter: T14.90XA

## 2016-09-06 HISTORY — DX: Major depressive disorder, single episode, unspecified: F32.9

## 2016-09-06 MED ORDER — ONDANSETRON HCL 8 MG PO TABS: 8.0000 mg | ORAL_TABLET | Freq: Three times a day (TID) | ORAL | 0 refills | Status: DC | PRN

## 2016-09-06 NOTE — ED Triage Notes (Signed)
Per patient nausea when having anxiety attack. Patient stated was seen here on 07/20/16 for her nausea and anxiety and was given Rx for nausea which help and would like refill.
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
## 2016-09-06 NOTE — Discharge Instructions (Signed)
Re[facility] go [facility] now for further evaluation.

## 2017-08-25 ENCOUNTER — Ambulatory Visit
Admission: EM | Admit: 2017-08-25 | Discharge: 2017-08-25 | Disposition: A | Discharge status: Home / Self Care | Payer: Self-pay | Attending: Family Medicine | Admitting: Family Medicine

## 2017-08-25 ENCOUNTER — Other Ambulatory Visit: Payer: Self-pay

## 2017-08-25 ENCOUNTER — Encounter: Payer: Self-pay | Admitting: Emergency Medicine

## 2017-08-25 DIAGNOSIS — R519 Headache, unspecified: Secondary | ICD-10-CM

## 2017-08-25 DIAGNOSIS — R112 Nausea with vomiting, unspecified: Secondary | ICD-10-CM

## 2017-08-25 DIAGNOSIS — R51 Headache: Secondary | ICD-10-CM

## 2017-08-25 LAB — RAPID STREP SCREEN (MED CTR MEBANE ONLY): STREPTOCOCCUS, GROUP A SCREEN (DIRECT): NEGATIVE

## 2017-08-25 MED ORDER — PROMETHAZINE HCL 25 MG PO TABS: 25.0000 mg | ORAL_TABLET | Freq: Three times a day (TID) | ORAL | 0 refills | Status: DC | PRN

## 2017-08-25 MED ORDER — CYCLOBENZAPRINE HCL 5 MG PO TABS: 5.0000 mg | ORAL_TABLET | Freq: Three times a day (TID) | ORAL | 0 refills | Status: DC | PRN
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
## 2017-08-25 NOTE — ED Triage Notes (Addendum)
Patient in today c/o dizziness, nausea, emesis, headache, sore throat, decreased appetite and "muscle spasms" x 3 days. Patient denies fever. Patient has tried OTC Aleve, sinus decongestant, Excedrin and Zofran without relief. Patient states she does have anxiety and is not seeing a therapist any more.

## 2017-08-25 NOTE — Discharge Instructions (Signed)
Fluids. Eat small meals.  Medication as needed for your symptoms.  Take care  Dr. Adriana Simasook

## 2017-08-28 LAB — CULTURE, GROUP A STREP (THRC)

## 2018-01-23 ENCOUNTER — Ambulatory Visit
Admission: EM | Admit: 2018-01-23 | Discharge: 2018-01-23 | Discharge status: Absent without leave | Payer: Medicaid Other

## 2018-12-28 ENCOUNTER — Other Ambulatory Visit: Payer: Self-pay

## 2018-12-28 ENCOUNTER — Emergency Department: Payer: Medicaid Other

## 2018-12-28 ENCOUNTER — Encounter: Payer: Self-pay | Admitting: Emergency Medicine

## 2018-12-28 ENCOUNTER — Emergency Department
Admission: EM | Admit: 2018-12-28 | Discharge: 2018-12-28 | Disposition: A | Discharge status: Home / Self Care | Payer: Medicaid Other | Attending: Emergency Medicine | Admitting: Emergency Medicine

## 2018-12-28 DIAGNOSIS — F410 Panic disorder [episodic paroxysmal anxiety] without agoraphobia: Secondary | ICD-10-CM | POA: Diagnosis not present

## 2018-12-28 DIAGNOSIS — F419 Anxiety disorder, unspecified: Secondary | ICD-10-CM | POA: Diagnosis not present

## 2018-12-28 DIAGNOSIS — Z79899 Other long term (current) drug therapy: Secondary | ICD-10-CM | POA: Diagnosis not present

## 2018-12-28 DIAGNOSIS — Z91040 Latex allergy status: Secondary | ICD-10-CM | POA: Diagnosis not present

## 2018-12-28 DIAGNOSIS — F17210 Nicotine dependence, cigarettes, uncomplicated: Secondary | ICD-10-CM | POA: Insufficient documentation

## 2018-12-28 DIAGNOSIS — R252 Cramp and spasm: Secondary | ICD-10-CM | POA: Insufficient documentation

## 2018-12-28 DIAGNOSIS — R519 Headache, unspecified: Secondary | ICD-10-CM | POA: Insufficient documentation

## 2018-12-28 LAB — COMPREHENSIVE METABOLIC PANEL
ALT: 15 U/L (ref 0–44)
AST: 18 U/L (ref 15–41)
Albumin: 4.2 g/dL (ref 3.5–5.0)
Alkaline Phosphatase: 54 U/L (ref 38–126)
Anion gap: 9 (ref 5–15)
BUN: <5 mg/dL — ABNORMAL LOW (ref 6–20)
CO2: 24 mmol/L (ref 22–32)
Calcium: 9.4 mg/dL (ref 8.9–10.3)
Chloride: 108 mmol/L (ref 98–111)
Creatinine, Ser: 0.95 mg/dL (ref 0.44–1.00)
GFR calc Af Amer: >60 mL/min (ref >60)
GFR calc non Af Amer: >60 mL/min (ref >60)
Glucose, Bld: 111 mg/dL — ABNORMAL HIGH (ref 70–99)
Potassium: 3.8 mmol/L (ref 3.5–5.1)
Sodium: 141 mmol/L (ref 135–145)
Total Bilirubin: 0.5 mg/dL (ref 0.3–1.2)
Total Protein: 7.2 g/dL (ref 6.5–8.1)

## 2018-12-28 LAB — CBC WITH DIFFERENTIAL/PLATELET
Abs Immature Granulocytes: 0.06 10*3/uL (ref 0.00–0.07)
Basophils Absolute: 0.1 10*3/uL (ref 0.0–0.1)
Basophils Relative: 1 %
Eosinophils Absolute: 0 10*3/uL (ref 0.0–0.5)
Eosinophils Relative: 0 %
HCT: 37.4 % (ref 36.0–46.0)
Hemoglobin: 12.4 g/dL (ref 12.0–15.0)
Immature Granulocytes: 1 %
Lymphocytes Relative: 15 %
Lymphs Abs: 1.8 10*3/uL (ref 0.7–4.0)
MCH: 26.6 pg (ref 26.0–34.0)
MCHC: 33.2 g/dL (ref 30.0–36.0)
MCV: 80.3 fL (ref 80.0–100.0)
Monocytes Absolute: 0.7 10*3/uL (ref 0.1–1.0)
Monocytes Relative: 6 %
Neutro Abs: 9.1 10*3/uL — ABNORMAL HIGH (ref 1.7–7.7)
Neutrophils Relative %: 77 %
Platelets: 218 10*3/uL (ref 150–400)
RBC: 4.66 MIL/uL (ref 3.87–5.11)
RDW: 18.3 % — ABNORMAL HIGH (ref 11.5–15.5)
Smear Review: NORMAL
WBC: 11.7 10*3/uL — ABNORMAL HIGH (ref 4.0–10.5)
nRBC: 0 % (ref 0.0–0.2)

## 2018-12-28 MED ORDER — ONDANSETRON 4 MG PO TBDP: 4.0000 mg | ORAL_TABLET | Freq: Three times a day (TID) | ORAL | 0 refills | Status: DC | PRN

## 2018-12-28 MED ORDER — ONDANSETRON 4 MG PO TBDP
4.0000 mg | ORAL_TABLET | Freq: Once | ORAL | Status: AC
  Administered 2018-12-28: 12:00:00 4 mg via ORAL
  Filled 2018-12-28: qty 1

## 2018-12-28 MED ORDER — ALPRAZOLAM 0.25 MG PO TABS: 0.2500 mg | ORAL_TABLET | Freq: Three times a day (TID) | ORAL | 0 refills | Status: DC | PRN
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
## 2018-12-28 NOTE — ED Triage Notes (Signed)
Pt in via [facility] from home, reports panic attack this morning, reports hx of same, denies any prescribed medication.  Pt complains of cramping to bilateral hands upon arrival, otherwise calm, cooperative, NAD noted at this time.

## 2018-12-28 NOTE — ED Notes (Signed)
Patient transported to CT 

## 2018-12-28 NOTE — Discharge Instructions (Addendum)
Follow-up with [facility].  You can call make an appointment or they have walk-in hours and a separate paper is given to you with this information.  Take the medication prescribed for you if needed for a panic attack.  There is a limited amount as this cannot be prescribed by emergency room for long-term use.

## 2018-12-28 NOTE — ED Notes (Signed)
Pt reports anxiety with panic attack.  States she has had same previously.  Pt calm and cooperative at this time.  Pt reports hands and feet tingling that is resolving now but still has cramping to same.

## 2018-12-28 NOTE — ED Notes (Signed)
Pt remains on cell phone throughout triage.

## 2019-01-10 ENCOUNTER — Ambulatory Visit
Admission: EM | Admit: 2019-01-10 | Discharge: 2019-01-10 | Disposition: A | Discharge status: Home / Self Care | Payer: Medicaid Other | Attending: Emergency Medicine | Admitting: Emergency Medicine

## 2019-01-10 ENCOUNTER — Other Ambulatory Visit: Payer: Self-pay

## 2019-01-10 DIAGNOSIS — N644 Mastodynia: Secondary | ICD-10-CM | POA: Diagnosis not present

## 2019-01-10 DIAGNOSIS — Z3201 Encounter for pregnancy test, result positive: Secondary | ICD-10-CM | POA: Insufficient documentation

## 2019-01-10 DIAGNOSIS — R112 Nausea with vomiting, unspecified: Secondary | ICD-10-CM

## 2019-01-10 LAB — PREGNANCY, URINE: Preg Test, Ur: POSITIVE — AB

## 2019-01-10 NOTE — ED Triage Notes (Signed)
Pt states that she took a home pregnancy test today and that it was positive. Pt states she wants another test here for confirmation. Pt alert and oriented X4, cooperative, RR even and unlabored, color WNL. Pt in NAD.
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
## 2019-01-10 NOTE — Discharge Instructions (Addendum)
Drink plenty of fluids.  No drugs, alcohol or smoking.  Start prenatal vitamins.  Recommend not using Xanax during pregnancy.  Follow-up with your OB/GYN at [facility] as planned.  Follow up with your primary care physician this week as needed. Return to Urgent care for new or worsening concerns.

## 2019-08-31 ENCOUNTER — Ambulatory Visit (INDEPENDENT_AMBULATORY_CARE_PROVIDER_SITE_OTHER): Payer: Medicaid Other

## 2019-08-31 ENCOUNTER — Ambulatory Visit
Admission: RE | Admit: 2019-08-31 | Discharge: 2019-08-31 | Disposition: A | Discharge status: Home / Self Care | Payer: Medicaid Other | Source: Ambulatory Visit

## 2019-08-31 ENCOUNTER — Other Ambulatory Visit: Payer: Self-pay

## 2019-08-31 VITALS — BP 103/84 | HR 101 | Temp 98.2°F | Resp 16

## 2019-08-31 DIAGNOSIS — S86911A Strain of unspecified muscle(s) and tendon(s) at lower leg level, right leg, initial encounter: Secondary | ICD-10-CM

## 2019-08-31 NOTE — Discharge Instructions (Signed)
Ice the area of pain for  20 minutes 3-4 times a day for 48 hours, then after that you may alternate with heat for a few more days  Take Ibuprofen 400 -600 mg every 8 hours for pain.   Follow up with your family Dr on Friday.

## 2019-08-31 NOTE — ED Triage Notes (Signed)
Pt states she tripped and fell down approx 8 stairs this AM, c/o right lower leg pain. Denies hitting head, no LOC. Ambulatory. No deformity.
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
## 2019-09-06 ENCOUNTER — Ambulatory Visit
Admission: EM | Admit: 2019-09-06 | Discharge: 2019-09-06 | Disposition: A | Discharge status: Home / Self Care | Payer: Medicaid Other | Attending: Family Medicine | Admitting: Family Medicine

## 2019-09-06 DIAGNOSIS — O219 Vomiting of pregnancy, unspecified: Secondary | ICD-10-CM | POA: Diagnosis present

## 2019-09-06 LAB — CBC WITH DIFFERENTIAL/PLATELET
Abs Immature Granulocytes: 0 10*3/uL (ref 0.00–0.07)
Basophils Absolute: 0.1 10*3/uL (ref 0.0–0.1)
Basophils Relative: 1 %
Eosinophils Absolute: 0.1 10*3/uL (ref 0.0–0.5)
Eosinophils Relative: 1 %
HCT: 38.9 % (ref 36.0–46.0)
Hemoglobin: 13 g/dL (ref 12.0–15.0)
Lymphocytes Relative: 38 %
Lymphs Abs: 3.3 10*3/uL (ref 0.7–4.0)
MCH: 26.5 pg (ref 26.0–34.0)
MCHC: 33.4 g/dL (ref 30.0–36.0)
MCV: 79.4 fL — ABNORMAL LOW (ref 80.0–100.0)
Monocytes Absolute: 0.6 10*3/uL (ref 0.1–1.0)
Monocytes Relative: 7 %
Neutro Abs: 4.6 10*3/uL (ref 1.7–7.7)
Neutrophils Relative %: 53 %
Platelets: 257 10*3/uL (ref 150–400)
RBC: 4.9 MIL/uL (ref 3.87–5.11)
RDW: 17.5 % — ABNORMAL HIGH (ref 11.5–15.5)
WBC: 8.6 10*3/uL (ref 4.0–10.5)
nRBC: 0 % (ref 0.0–0.2)

## 2019-09-06 LAB — URINALYSIS, COMPLETE (UACMP) WITH MICROSCOPIC
Bacteria, UA: NONE SEEN
Bilirubin Urine: NEGATIVE
Glucose, UA: NEGATIVE mg/dL
Hgb urine dipstick: NEGATIVE
Ketones, ur: NEGATIVE mg/dL
Leukocytes,Ua: NEGATIVE
Nitrite: NEGATIVE
Protein, ur: 30 mg/dL — AB
RBC / HPF: NONE SEEN RBC/hpf (ref 0–5)
Specific Gravity, Urine: 1.02 (ref 1.005–1.030)
WBC, UA: NONE SEEN WBC/hpf (ref 0–5)
pH: 8.5 — ABNORMAL HIGH (ref 5.0–8.0)

## 2019-09-06 LAB — COMPREHENSIVE METABOLIC PANEL
ALT: 18 U/L (ref 0–44)
AST: 20 U/L (ref 15–41)
Albumin: 4.4 g/dL (ref 3.5–5.0)
Alkaline Phosphatase: 53 U/L (ref 38–126)
Anion gap: 8 (ref 5–15)
BUN: <5 mg/dL — ABNORMAL LOW (ref 6–20)
CO2: 24 mmol/L (ref 22–32)
Calcium: 9.3 mg/dL (ref 8.9–10.3)
Chloride: 105 mmol/L (ref 98–111)
Creatinine, Ser: 0.93 mg/dL (ref 0.44–1.00)
GFR calc Af Amer: >60 mL/min (ref >60)
GFR calc non Af Amer: >60 mL/min (ref >60)
Glucose, Bld: 98 mg/dL (ref 70–99)
Potassium: 3.9 mmol/L (ref 3.5–5.1)
Sodium: 137 mmol/L (ref 135–145)
Total Bilirubin: 0.5 mg/dL (ref 0.3–1.2)
Total Protein: 7.8 g/dL (ref 6.5–8.1)

## 2019-09-06 LAB — PREGNANCY, URINE: Preg Test, Ur: POSITIVE — AB

## 2019-09-06 MED ORDER — ONDANSETRON 4 MG PO TBDP: 4.0000 mg | ORAL_TABLET | Freq: Once | ORAL | Status: DC

## 2019-09-06 MED ORDER — DOXYLAMINE-PYRIDOXINE 10-10 MG PO TBEC: DELAYED_RELEASE_TABLET | ORAL | 3 refills | Status: DC

## 2019-09-06 MED ORDER — ONDANSETRON 4 MG PO TBDP
4.0000 mg | ORAL_TABLET | Freq: Once | ORAL | Status: AC
  Administered 2019-09-06: 4 mg via ORAL

## 2019-09-06 NOTE — Discharge Instructions (Signed)
Medication as directed.  Lots of fluids.  Call OB GYN for an appt.  Take care  Dr. Adriana Simas

## 2019-09-06 NOTE — ED Triage Notes (Signed)
Pt reports having n/v/d x 4 days.

## 2019-09-06 NOTE — ED Provider Notes (Signed)
[facility]    CSN: [id number] Arrival date & time: 09/06/19  1093      History   Chief Complaint Chief Complaint  Patient presents with  . Nausea   HPI  29 year old female presents with nausea, vomiting, diarrhea.  3-4 day history of nausea, vomiting, diarrhea.  Patient reports that her menstrual cycles have not been normal either.  Patient reports that she had 1 day of bleeding on May 19.  Prior to that she had a normal menstrual cycle in April.  Patient reports that she is having a migraine as well.  She states that she was pulled over by a police officer this morning and was advised to come in for evaluation as she was very nauseous and sick.  She reports some associated abdominal pain.  No documented fever.  Patient states that she feels dehydrated.  She states that she tried to take a Zofran at home but could not tolerate and threw it up.  No relieving factors.   Home Medications    Prior to Admission medications   Medication Sig Start Date End Date Taking? Authorizing Provider  Doxylamine-Pyridoxine 10-10 MG TBEC 2 at bedtime on day 1 & 2; if symptoms persist, take 1 tablet in am and 2 tablets at bedtime on day 3; if symptoms persist, may increase to 1 tablet in am, 1 tablet afternoon, and 2 tablets at bedtime on day 4 09/06/19   Tommie Sams, DO  levonorgestrel (MIRENA) 20 MCG/24HR IUD 1 each by Intrauterine route once.  01/10/19  [provider]  promethazine (PHENERGAN) 25 MG tablet Take 1 tablet (25 mg total) by mouth every 8 (eight) hours as needed for nausea or vomiting. 08/25/17 01/10/19  Tommie Sams, DO    Family History Family History  Problem Relation Age of Onset  . Cancer Mother        lymphoma  . Healthy Father   . Ovarian cancer Maternal Grandmother   . Breast cancer Maternal Grandmother     Social History Social History   Tobacco Use  . Smoking status: Current Every Day Smoker    Packs/day: 0.50    Types: Cigarettes  .  Smokeless tobacco: Never Used  Vaping Use  . Vaping Use: Never used  Substance Use Topics  . Alcohol use: No  . Drug use: No     Allergies   Latex   Review of Systems Review of Systems  Constitutional: Positive for fatigue.  Gastrointestinal: Positive for diarrhea, nausea and vomiting.   Physical Exam Triage Vital Signs ED Triage Vitals  Enc Vitals Group     BP 09/06/19 0924 (!) 108/59     Pulse Rate 09/06/19 0924 74     Resp 09/06/19 0924 16     Temp 09/06/19 0924 98.4 F (36.9 C)     Temp Source 09/06/19 0924 Oral     SpO2 09/06/19 0924 100 %     Weight 09/06/19 0926 135 lb (61.2 kg)     Height 09/06/19 0926 5\' 2"  (1.575 m)     Head Circumference --      Peak Flow --      Pain Score 09/06/19 0925 3     Pain Loc --      Pain Edu? --      Excl. in GC? --    Updated Vital Signs BP (!) 108/59   Pulse 74   Temp 98.4 F (36.9 C) (Oral)   Resp 16  Ht 5\' 2"  (1.575 m)   Wt 61.2 kg   SpO2 100%   BMI 24.69 kg/m   Visual Acuity Right Eye Distance:   Left Eye Distance:   Bilateral Distance:    Right Eye Near:   Left Eye Near:    Bilateral Near:     Physical Exam Vitals and nursing note reviewed.  Constitutional:      General: She is not in acute distress.    Appearance: Normal appearance. She is not ill-appearing.  HENT:     Head: Normocephalic and atraumatic.     Mouth/Throat:     Comments: Slightly dry mucous membranes. Eyes:     General:        Right eye: No discharge.        Left eye: No discharge.     Conjunctiva/sclera: Conjunctivae normal.  Cardiovascular:     Rate and Rhythm: Normal rate and regular rhythm.     Heart sounds: No murmur heard.   Pulmonary:     Effort: Pulmonary effort is normal.     Breath sounds: Normal breath sounds. No wheezing, rhonchi or rales.  Abdominal:     General: There is no distension.     Palpations: Abdomen is soft.     Tenderness: There is no abdominal tenderness.  Neurological:     Mental Status: She is  alert.  Psychiatric:        Mood and Affect: Mood normal.        Behavior: Behavior normal.    UC Treatments / Results  Labs (all labs ordered are listed, but only abnormal results are displayed) Labs Reviewed  PREGNANCY, URINE - Abnormal; Notable for the following components:      Result Value   Preg Test, Ur POSITIVE (*)    All other components within normal limits  URINALYSIS, COMPLETE (UACMP) WITH MICROSCOPIC - Abnormal; Notable for the following components:   APPearance HAZY (*)    pH 8.5 (*)    Protein, ur 30 (*)    All other components within normal limits  CBC WITH DIFFERENTIAL/PLATELET - Abnormal; Notable for the following components:   MCV 79.4 (*)    RDW 17.5 (*)    All other components within normal limits  COMPREHENSIVE METABOLIC PANEL - Abnormal; Notable for the following components:   BUN <5 (*)    All other components within normal limits    EKG   Radiology No results found.  Procedures Procedures (including critical care time)  Medications Ordered in UC Medications  ondansetron (ZOFRAN-ODT) disintegrating tablet 4 mg (4 mg Oral Given 09/06/19 1019)    Initial Impression / Assessment and Plan / UC Course  I have reviewed the triage vital signs and the nursing notes.  Pertinent labs & imaging results that were available during my care of the patient were reviewed by me and considered in my medical decision making (see chart for details).    29 year old female presents for evaluation of nausea, vomiting, and diarrhea.  Labs obtained.  Laboratory studies reviewed.  CBC with no evident anemia.  Metabolic panel unremarkable.  Urine pregnancy test was positive.  Patient informed of positive result.  Patient states that she will follow-up with her OB/GYN's office.  Zofran given here.  Sending home on [place].  Advised close follow-up.  Supportive care.  Work note given.  Final Clinical Impressions(s) / UC Diagnoses   Final diagnoses:  Nausea and vomiting  during pregnancy     Discharge Instructions  Medication as directed.  Lots of fluids.  Call OB GYN for an appt.  Take care  Dr. Lacinda Axon     ED Prescriptions    Medication Sig Dispense Auth. Provider   Doxylamine-Pyridoxine 10-10 MG TBEC 2 at bedtime on day 1 & 2; if symptoms persist, take 1 tablet in am and 2 tablets at bedtime on day 3; if symptoms persist, may increase to 1 tablet in am, 1 tablet afternoon, and 2 tablets at bedtime on day 4 60 tablet Coral Spikes, DO     PDMP not reviewed this encounter.   Coral Spikes, DO 09/06/19 1100

## 2019-10-26 ENCOUNTER — Other Ambulatory Visit: Payer: Self-pay

## 2019-10-26 ENCOUNTER — Ambulatory Visit
Admission: EM | Admit: 2019-10-26 | Discharge: 2019-10-26 | Disposition: A | Discharge status: Home / Self Care | Payer: Medicaid Other | Attending: Family Medicine | Admitting: Family Medicine

## 2019-10-26 DIAGNOSIS — R300 Dysuria: Secondary | ICD-10-CM | POA: Insufficient documentation

## 2019-10-26 LAB — URINALYSIS, COMPLETE (UACMP) WITH MICROSCOPIC
Bilirubin Urine: NEGATIVE
Glucose, UA: NEGATIVE mg/dL
Ketones, ur: NEGATIVE mg/dL
Leukocytes,Ua: NEGATIVE
Nitrite: NEGATIVE
Protein, ur: NEGATIVE mg/dL
Specific Gravity, Urine: 1.015 (ref 1.005–1.030)
pH: 7 (ref 5.0–8.0)

## 2019-10-26 NOTE — ED Triage Notes (Signed)
Patient in today w/ c/o pain during urination, discomfort in pelvis w/ throbbing. Patient is approx. [redacted] weeks pregnant.

## 2019-10-26 NOTE — Discharge Instructions (Addendum)
No evidence of UTI.  Lots of fluids.  If persists or worsens, go to the hospital.  Take care  Dr. Adriana Simas
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
## 2020-11-22 ENCOUNTER — Other Ambulatory Visit: Payer: Self-pay

## 2020-11-22 ENCOUNTER — Ambulatory Visit (INDEPENDENT_AMBULATORY_CARE_PROVIDER_SITE_OTHER): Payer: Medicaid Other

## 2020-11-22 ENCOUNTER — Ambulatory Visit
Admission: EM | Admit: 2020-11-22 | Discharge: 2020-11-22 | Disposition: A | Discharge status: Home / Self Care | Payer: Medicaid Other | Attending: Physician Assistant | Admitting: Physician Assistant

## 2020-11-22 ENCOUNTER — Ambulatory Visit: Admission: EM | Admit: 2020-11-22 | Payer: Self-pay

## 2020-11-22 DIAGNOSIS — M25531 Pain in right wrist: Secondary | ICD-10-CM

## 2020-11-22 DIAGNOSIS — S63501A Unspecified sprain of right wrist, initial encounter: Secondary | ICD-10-CM

## 2020-11-22 MED ORDER — IBUPROFEN 800 MG PO TABS: 800.0000 mg | ORAL_TABLET | Freq: Three times a day (TID) | ORAL | 0 refills | Status: AC | PRN

## 2020-11-22 NOTE — ED Triage Notes (Signed)
Pt here with C/O right wrist pain for 3 days, after carrying a piece of heavy furniture alone. Has been wrapping it with ACE wrap and a brace she has at home.

## 2020-11-22 NOTE — Discharge Instructions (Addendum)
SPRAIN: X-ray is normal. Stressed avoiding painful activities . Reviewed RICE guidelines. Use medications as directed, including NSAIDs. If no NSAIDs have been prescribed for you today, you may take Aleve or Motrin over the counter. May use Tylenol in between doses of NSAIDs.  If no improvement in the next 1-2 weeks, f/u with PCP or return to our office for reexamination, and please feel free to call or return at any time for any questions or concerns you may have and we will be happy to help you!
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
## 2020-12-22 ENCOUNTER — Ambulatory Visit
Admission: EM | Admit: 2020-12-22 | Discharge: 2020-12-22 | Disposition: A | Discharge status: Home / Self Care | Payer: Medicaid Other | Attending: Emergency Medicine | Admitting: Emergency Medicine

## 2020-12-22 ENCOUNTER — Ambulatory Visit (INDEPENDENT_AMBULATORY_CARE_PROVIDER_SITE_OTHER): Payer: Medicaid Other

## 2020-12-22 DIAGNOSIS — M79672 Pain in left foot: Secondary | ICD-10-CM | POA: Diagnosis not present

## 2020-12-22 MED ORDER — IBUPROFEN 600 MG PO TABS
600.0000 mg | ORAL_TABLET | Freq: Four times a day (QID) | ORAL | 0 refills | Status: DC | PRN
Start: 2020-12-22 — End: 2022-10-20

## 2020-12-22 MED ORDER — TRAMADOL HCL 50 MG PO TABS: 50.0000 mg | ORAL_TABLET | Freq: Four times a day (QID) | ORAL | 0 refills | Status: DC | PRN
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
## 2020-12-22 NOTE — Discharge Instructions (Addendum)
Your x-rays today did not reveal any fractures.  It is not clear as to what is causing your foot pain.  Take the ibuprofen 6 and milligrams every 6 hours with food as needed for mild to moderate pain.  Use the tramadol every 6 hours as needed for severe pain.  Do not take it with alcohol and do not drive if you take it as it make you drowsy.  You may want to try wearing shoes with a more narrow toe box as your sneakers may be putting pressure on the outside edge of your foot leading to the pain.  Ice your foot for torments at a time 2-3 times a day to help decrease inflammation which may be causing the pain.  Elevate your foot is much as possible to decrease inflammation as well.  If your symptoms do not improve I recommend following up with podiatry.

## 2020-12-22 NOTE — ED Triage Notes (Signed)
Pt c/o cramps to her left foot for several days, pt states now she can barely walk on the foot. Pt denies any known injury. Pt denies any other muscle cramping.

## 2022-08-21 DIAGNOSIS — Z113 Encounter for screening for infections with a predominantly sexual mode of transmission: Secondary | ICD-10-CM | POA: Diagnosis not present

## 2022-08-21 DIAGNOSIS — F32A Depression, unspecified: Secondary | ICD-10-CM | POA: Diagnosis not present

## 2022-08-21 DIAGNOSIS — Z0001 Encounter for general adult medical examination with abnormal findings: Secondary | ICD-10-CM | POA: Diagnosis not present

## 2022-08-21 DIAGNOSIS — N6324 Unspecified lump in the left breast, lower inner quadrant: Secondary | ICD-10-CM | POA: Diagnosis not present

## 2022-08-21 DIAGNOSIS — Z01419 Encounter for gynecological examination (general) (routine) without abnormal findings: Secondary | ICD-10-CM | POA: Diagnosis not present

## 2022-10-16 DIAGNOSIS — N6459 Other signs and symptoms in breast: Secondary | ICD-10-CM | POA: Diagnosis not present

## 2022-10-16 DIAGNOSIS — R92333 Mammographic heterogeneous density, bilateral breasts: Secondary | ICD-10-CM | POA: Diagnosis not present

## 2022-10-16 DIAGNOSIS — N6324 Unspecified lump in the left breast, lower inner quadrant: Secondary | ICD-10-CM | POA: Diagnosis not present

## 2022-10-16 DIAGNOSIS — N6489 Other specified disorders of breast: Secondary | ICD-10-CM | POA: Diagnosis not present

## 2022-10-20 ENCOUNTER — Ambulatory Visit
Admission: EM | Admit: 2022-10-20 | Discharge: 2022-10-20 | Disposition: A | Discharge status: Home / Self Care | Payer: Medicaid Other | Attending: Urgent Care | Admitting: Urgent Care

## 2022-10-20 DIAGNOSIS — N644 Mastodynia: Secondary | ICD-10-CM | POA: Diagnosis not present

## 2022-10-20 MED ORDER — CEPHALEXIN 500 MG PO CAPS: 500.0000 mg | ORAL_CAPSULE | Freq: Four times a day (QID) | ORAL | 0 refills | Status: AC

## 2022-10-20 MED ORDER — IBUPROFEN 600 MG PO TABS: 600.0000 mg | ORAL_TABLET | Freq: Four times a day (QID) | ORAL | 0 refills | Status: AC | PRN

## 2022-10-20 NOTE — Discharge Instructions (Addendum)
I suspect your breast pain to be coming from a possible blocked gland. Please apply a warm moist compress to the affected area of your breast, continue to pump.  It would also be beneficial for you to milk the duct to further express any discharge if possible. Monitor the appearance of it, make sure it is not foul-smelling or bloody. Please wear a supportive but not constrictive bra. Take ibuprofen every 6 hours as needed with food. Take the cephalexin 4 times daily until gone, a total of 5 days. If you develop a fever or worsening pain, return for recheck. If symptoms persist, follow-up with your gynecologist.

## 2022-10-20 NOTE — ED Triage Notes (Signed)
Left breast pain. Started today. Recently stopped breast feeding. Tried to pump this morning and was "oily" stopped pumping and nursing 4 months ago. 8/10 ; under breast. Ha mammogram last week everything was good.
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
## 2022-10-31 DIAGNOSIS — N644 Mastodynia: Secondary | ICD-10-CM | POA: Diagnosis not present

## 2022-11-03 DIAGNOSIS — N6324 Unspecified lump in the left breast, lower inner quadrant: Secondary | ICD-10-CM | POA: Diagnosis not present

## 2022-11-03 DIAGNOSIS — N6325 Unspecified lump in the left breast, overlapping quadrants: Secondary | ICD-10-CM | POA: Diagnosis not present

## 2022-11-03 DIAGNOSIS — N6342 Unspecified lump in left breast, subareolar: Secondary | ICD-10-CM | POA: Diagnosis not present

## 2022-11-03 DIAGNOSIS — N644 Mastodynia: Secondary | ICD-10-CM | POA: Diagnosis not present

## 2022-11-03 DIAGNOSIS — N6320 Unspecified lump in the left breast, unspecified quadrant: Secondary | ICD-10-CM | POA: Diagnosis not present

## 2022-11-03 DIAGNOSIS — Z803 Family history of malignant neoplasm of breast: Secondary | ICD-10-CM | POA: Diagnosis not present

## 2022-11-03 DIAGNOSIS — Z87891 Personal history of nicotine dependence: Secondary | ICD-10-CM | POA: Diagnosis not present

## 2022-11-26 DIAGNOSIS — N6042 Mammary duct ectasia of left breast: Secondary | ICD-10-CM | POA: Diagnosis not present

## 2022-11-26 DIAGNOSIS — R928 Other abnormal and inconclusive findings on diagnostic imaging of breast: Secondary | ICD-10-CM | POA: Diagnosis not present

## 2023-01-20 ENCOUNTER — Ambulatory Visit
Admission: EM | Admit: 2023-01-20 | Discharge: 2023-01-20 | Disposition: A | Discharge status: Home / Self Care | Payer: Medicaid Other | Attending: Emergency Medicine | Admitting: Emergency Medicine

## 2023-01-20 ENCOUNTER — Ambulatory Visit: Payer: Medicaid Other

## 2023-01-20 DIAGNOSIS — M25552 Pain in left hip: Secondary | ICD-10-CM | POA: Diagnosis not present

## 2023-01-20 DIAGNOSIS — M5432 Sciatica, left side: Secondary | ICD-10-CM

## 2023-01-20 DIAGNOSIS — S79912A Unspecified injury of left hip, initial encounter: Secondary | ICD-10-CM | POA: Diagnosis not present

## 2023-01-20 DIAGNOSIS — M4050 Lordosis, unspecified, site unspecified: Secondary | ICD-10-CM

## 2023-01-20 MED ORDER — CYCLOBENZAPRINE HCL 5 MG PO TABS: 5.0000 mg | ORAL_TABLET | Freq: Three times a day (TID) | ORAL | 0 refills | Status: AC | PRN

## 2023-01-20 MED ORDER — HYDROCODONE-ACETAMINOPHEN 5-325 MG PO TABS: 1.0000 | ORAL_TABLET | Freq: Four times a day (QID) | ORAL | 0 refills | Status: AC | PRN

## 2023-01-20 NOTE — Discharge Instructions (Addendum)
Check my chart for results,no obvious fracture noted by this provider, however we will contact you if acute critical result is notified by radiologist. Take flexeril and vicodin as prescribed (drowsiness precautions), may take ibuprofen as label directed as well for pain, do not take tylenol with Vicodin as it has tylenol in it. May use heat or ice to back/hip for comfort. May use topical Biofreeze for pain. Please follow up with Orthopedics/PCP, may need to referral to physical therapy for further back pain management-call and make an appt. GO immediately to ER for loss of bowel and bladder,loss of function, saddle numbness, worsening pain, etc.   [facility]: [address] [place], [place] [address] Phone: [phone] Urgent care hours 8a-7:30p Mon-Sat

## 2023-01-20 NOTE — ED Triage Notes (Addendum)
Left hip pain x 5 days. Patient states that she was sitting on the floor changing her childs pamper and got up. She's been in pain since. Patient has been using IBU with no relief.
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
## 2023-01-22 DIAGNOSIS — M25652 Stiffness of left hip, not elsewhere classified: Secondary | ICD-10-CM | POA: Diagnosis not present

## 2023-01-22 DIAGNOSIS — M25552 Pain in left hip: Secondary | ICD-10-CM | POA: Diagnosis not present

## 2023-03-26 DIAGNOSIS — N939 Abnormal uterine and vaginal bleeding, unspecified: Secondary | ICD-10-CM | POA: Diagnosis not present

## 2023-03-26 DIAGNOSIS — Z113 Encounter for screening for infections with a predominantly sexual mode of transmission: Secondary | ICD-10-CM | POA: Diagnosis not present

## 2023-04-03 DIAGNOSIS — N939 Abnormal uterine and vaginal bleeding, unspecified: Secondary | ICD-10-CM | POA: Diagnosis not present

## 2023-04-08 ENCOUNTER — Emergency Department
Admission: EM | Admit: 2023-04-08 | Discharge: 2023-04-08 | Disposition: A | Discharge status: Home / Self Care | Payer: Medicaid Other | Attending: Emergency Medicine | Admitting: Emergency Medicine

## 2023-04-08 ENCOUNTER — Other Ambulatory Visit: Payer: Self-pay

## 2023-04-08 DIAGNOSIS — R404 Transient alteration of awareness: Secondary | ICD-10-CM | POA: Diagnosis not present

## 2023-04-08 DIAGNOSIS — K529 Noninfective gastroenteritis and colitis, unspecified: Secondary | ICD-10-CM | POA: Diagnosis not present

## 2023-04-08 DIAGNOSIS — R109 Unspecified abdominal pain: Secondary | ICD-10-CM | POA: Diagnosis present

## 2023-04-08 DIAGNOSIS — E876 Hypokalemia: Secondary | ICD-10-CM | POA: Insufficient documentation

## 2023-04-08 DIAGNOSIS — I959 Hypotension, unspecified: Secondary | ICD-10-CM | POA: Diagnosis not present

## 2023-04-08 DIAGNOSIS — R0902 Hypoxemia: Secondary | ICD-10-CM | POA: Diagnosis not present

## 2023-04-08 DIAGNOSIS — R55 Syncope and collapse: Secondary | ICD-10-CM | POA: Diagnosis not present

## 2023-04-08 DIAGNOSIS — R0689 Other abnormalities of breathing: Secondary | ICD-10-CM | POA: Diagnosis not present

## 2023-04-08 LAB — CBC WITH DIFFERENTIAL/PLATELET
Abs Immature Granulocytes: 0.03 10*3/uL (ref 0.00–0.07)
Basophils Absolute: 0 10*3/uL (ref 0.0–0.1)
Basophils Relative: 0 %
Eosinophils Absolute: 0.3 10*3/uL (ref 0.0–0.5)
Eosinophils Relative: 3 %
HCT: 36.7 % (ref 36.0–46.0)
Hemoglobin: 12 g/dL (ref 12.0–15.0)
Immature Granulocytes: 0 %
Lymphocytes Relative: 27 %
Lymphs Abs: 2.5 10*3/uL (ref 0.7–4.0)
MCH: 26.4 pg (ref 26.0–34.0)
MCHC: 32.7 g/dL (ref 30.0–36.0)
MCV: 80.7 fL (ref 80.0–100.0)
Monocytes Absolute: 0.8 10*3/uL (ref 0.1–1.0)
Monocytes Relative: 9 %
Neutro Abs: 5.6 10*3/uL (ref 1.7–7.7)
Neutrophils Relative %: 61 %
Platelets: 227 10*3/uL (ref 150–400)
RBC: 4.55 MIL/uL (ref 3.87–5.11)
RDW: 17.8 % — ABNORMAL HIGH (ref 11.5–15.5)
Smear Review: NORMAL
WBC: 9.2 10*3/uL (ref 4.0–10.5)
nRBC: 0 % (ref 0.0–0.2)

## 2023-04-08 LAB — BASIC METABOLIC PANEL
Anion gap: 11 (ref 5–15)
BUN: 7 mg/dL (ref 6–20)
CO2: 18 mmol/L — ABNORMAL LOW (ref 22–32)
Calcium: 8.3 mg/dL — ABNORMAL LOW (ref 8.9–10.3)
Chloride: 107 mmol/L (ref 98–111)
Creatinine, Ser: 1.02 mg/dL — ABNORMAL HIGH (ref 0.44–1.00)
GFR, Estimated: >60 mL/min (ref >60)
Glucose, Bld: 103 mg/dL — ABNORMAL HIGH (ref 70–99)
Potassium: 3 mmol/L — ABNORMAL LOW (ref 3.5–5.1)
Sodium: 136 mmol/L (ref 135–145)

## 2023-04-08 MED ORDER — SODIUM CHLORIDE 0.9 % IV SOLN: Freq: Once | INTRAVENOUS | Status: AC

## 2023-04-08 MED ORDER — SODIUM CHLORIDE 0.9 % IV SOLN
12.5000 mg | Freq: Once | INTRAVENOUS | Status: AC
  Administered 2023-04-08: 12.5 mg via INTRAVENOUS
  Filled 2023-04-08: qty 0.5

## 2023-04-08 MED ORDER — ONDANSETRON HCL 4 MG/2ML IJ SOLN: 4.0000 mg | Freq: Once | INTRAMUSCULAR | Status: DC

## 2023-04-08 MED ORDER — DIPHENHYDRAMINE HCL 50 MG/ML IJ SOLN
12.5000 mg | Freq: Once | INTRAMUSCULAR | Status: AC
  Administered 2023-04-08: 12.5 mg via INTRAVENOUS
  Filled 2023-04-08: qty 1

## 2023-04-08 MED ORDER — PROMETHAZINE HCL 12.5 MG PO TABS: 12.5000 mg | ORAL_TABLET | Freq: Four times a day (QID) | ORAL | 0 refills | Status: AC | PRN

## 2023-04-08 MED ORDER — SODIUM CHLORIDE 0.9 % IV SOLN
12.5000 mg | Freq: Once | INTRAVENOUS | Status: AC
  Administered 2023-04-08: 12.5 mg via INTRAVENOUS
  Filled 2023-04-08: qty 12.5

## 2023-04-08 NOTE — ED Notes (Signed)
Pt reports she is unable to stand, walk or move her legs. It took two staff members to pick her up from the recliner she was in and put her onto the bed/stretcher.

## 2023-04-08 NOTE — ED Notes (Signed)
Pt reports she is unable to walk or lift her legs. Dr. Cyril Loosen informed and he stated he would be into to reassess her.

## 2023-04-08 NOTE — ED Provider Notes (Signed)
[facility] Provider Note    Event Date/Time   First MD Initiated Contact with Patient 04/08/23 1527     (approximate)   History   Abdominal Pain   HPI  Paula Green is a 33 y.o. female who presents with abdominal cramping, nausea vomiting over the last 4 to 5 days.  She reports her children had similar symptoms but recovered quickly.  She complains of extremity cramping also     Physical Exam   Triage Vital Signs: ED Triage Vitals [04/08/23 1141]  Encounter Vitals Group     BP 108/77     Systolic BP Percentile      Diastolic BP Percentile      Pulse Rate 81     Resp 16     Temp 98.6 F (37 C)     Temp Source Oral     SpO2 99 %     Weight      Height 1.626 m (5\' 4" )     Head Circumference      Peak Flow      Pain Score 10     Pain Loc      Pain Education      Exclude from Growth Chart     Most recent vital signs: Vitals:   04/08/23 1648 04/08/23 1930  BP: 97/60 110/76  Pulse: 66 73  Resp: 16 16  Temp:    SpO2: 100% 100%     General: Awake, no distress.  CV:  Good peripheral perfusion.  Resp:  Normal effort.  Abd:  No distention.  Soft, nontender Other:  Normal strength in the upper and lower extremities, sensation appears normal   ED Results / Procedures / Treatments   Labs (all labs ordered are listed, but only abnormal results are displayed) Labs Reviewed  CBC WITH DIFFERENTIAL/PLATELET - Abnormal; Notable for the following components:      Result Value   RDW 17.8 (*)    All other components within normal limits  BASIC METABOLIC PANEL - Abnormal; Notable for the following components:   Potassium 3.0 (*)    CO2 18 (*)    Glucose, Bld 103 (*)    Creatinine, Ser 1.02 (*)    Calcium 8.3 (*)    All other components within normal limits     EKG     RADIOLOGY     PROCEDURES:  Critical Care performed:   Procedures   MEDICATIONS ORDERED IN ED: Medications  diphenhydrAMINE (BENADRYL) injection  12.5 mg (12.5 mg Intravenous Given 04/08/23 1148)  0.9 %  sodium chloride infusion (0 mLs Intravenous Stopped 04/08/23 1834)  promethazine (PHENERGAN) 12.5 mg in sodium chloride 0.9 % 50 mL IVPB (0 mg Intravenous Stopped 04/08/23 1649)  0.9 %  sodium chloride infusion (0 mLs Intravenous Stopped 04/08/23 2037)  promethazine (PHENERGAN) 12.5 mg in sodium chloride 0.9 % 50 mL IVPB (0 mg Intravenous Stopped 04/08/23 1927)     IMPRESSION / MDM / ASSESSMENT AND PLAN / ED COURSE  I reviewed the triage vital signs and the nursing notes. Patient's presentation is most consistent with acute presentation with potential threat to life or bodily function.  Patient presents with nausea vomiting abdominal cramping as described above, suspicious for norovirus which is prevalent likely at this time especially given her children had similar symptoms just prior to her illness.  Lab work reviewed, mild hypokalemia  otherwise reassuring, describes some almost contraction-like symptoms primarily in her right hand and right leg which seem  to be improving, possibly related to dehydration, will give IV fluids, continued nausea medication and reevaluate.  Patient seen to be improved, when I stood her up she complained of mild dizziness and nausea, will give additional fluids and IV antiemetics  ----------------------------------------- 6:59 PM on 04/08/2023 ----------------------------------------- Patient was able to walk to the bathroom without difficulty on her own.  She is feeling better, no further dizziness, nausea vomiting controlled, symptoms consistent with viral gastroenteritis.  No indication for admission, appropriate for outpatient management, return precautions discussed        FINAL CLINICAL IMPRESSION(S) / ED DIAGNOSES   Final diagnoses:  Gastroenteritis     Rx / DC Orders   ED Discharge Orders          Ordered    promethazine (PHENERGAN) 12.5 MG tablet  Every 6 hours PRN        04/08/23 1735              Note:  This document was prepared using Dragon voice recognition software and may include unintentional dictation errors.   Jene Every, MD 04/08/23 2053

## 2023-04-08 NOTE — ED Notes (Signed)
Pt was able to slowly walk to restroom with one person assist.

## 2023-04-08 NOTE — ED Triage Notes (Signed)
Pt arrived via EMS from home d/t n/v and diarrhea x6 days. Pt denies any other medical hx

## 2023-04-08 NOTE — ED Provider Triage Note (Signed)
Emergency Medicine Provider Triage Evaluation Note  Paula Green , a 33 y.o. female  was evaluated in triage.  Pt complains of nausea, vomiting, and diarrhea x 5 days. EMS gave 500NS and zofran on scene. They report questionable dystonic reaction after giving zofran. No Benadryl given. Patient currently without dystonia   Physical Exam  There were no vitals taken for this visit. Gen:   Awake, no distress   Resp:  Normal effort  MSK:   Moves extremities without difficulty  Other:    Medical Decision Making  Medically screening exam initiated at 11:34 AM.  Appropriate orders placed.  Paula Green was informed that the remainder of the evaluation will be completed by another provider, this initial triage assessment does not replace that evaluation, and the importance of remaining in the ED until their evaluation is complete.  Benadryl ordered in case dystonic symptoms return since patient will be awaiting ER room assignment. She is in no distress at this time and shows no evidence of ongoing reaction to medication/seizure-like activity/or other neurological condition.   Chinita Pester, FNP 04/08/23 1146

## 2023-04-08 NOTE — ED Notes (Addendum)
..  The patient is A&OX4, ambulatory at d/c with independent steady gait,but wheeled out of ED via wheelchair. NAD. Pt verbalized understanding of d/c instructions, prescription and follow up care.

## 2023-05-14 DIAGNOSIS — N939 Abnormal uterine and vaginal bleeding, unspecified: Secondary | ICD-10-CM | POA: Diagnosis not present

## 2023-05-14 DIAGNOSIS — D392 Neoplasm of uncertain behavior of placenta: Secondary | ICD-10-CM | POA: Diagnosis not present

## 2023-05-14 DIAGNOSIS — Z3202 Encounter for pregnancy test, result negative: Secondary | ICD-10-CM | POA: Diagnosis not present

## 2023-06-01 DIAGNOSIS — N6323 Unspecified lump in the left breast, lower outer quadrant: Secondary | ICD-10-CM | POA: Diagnosis not present

## 2023-09-16 IMAGING — CR DG FOOT COMPLETE 3+V*L*
3 series · 3 of 3 positions shown · non-contrast
Comparison: January 03, 2014

CLINICAL DATA: Pain of lateral left foot.

EXAM:
LEFT FOOT - COMPLETE 3+ VIEW

[foot ap]
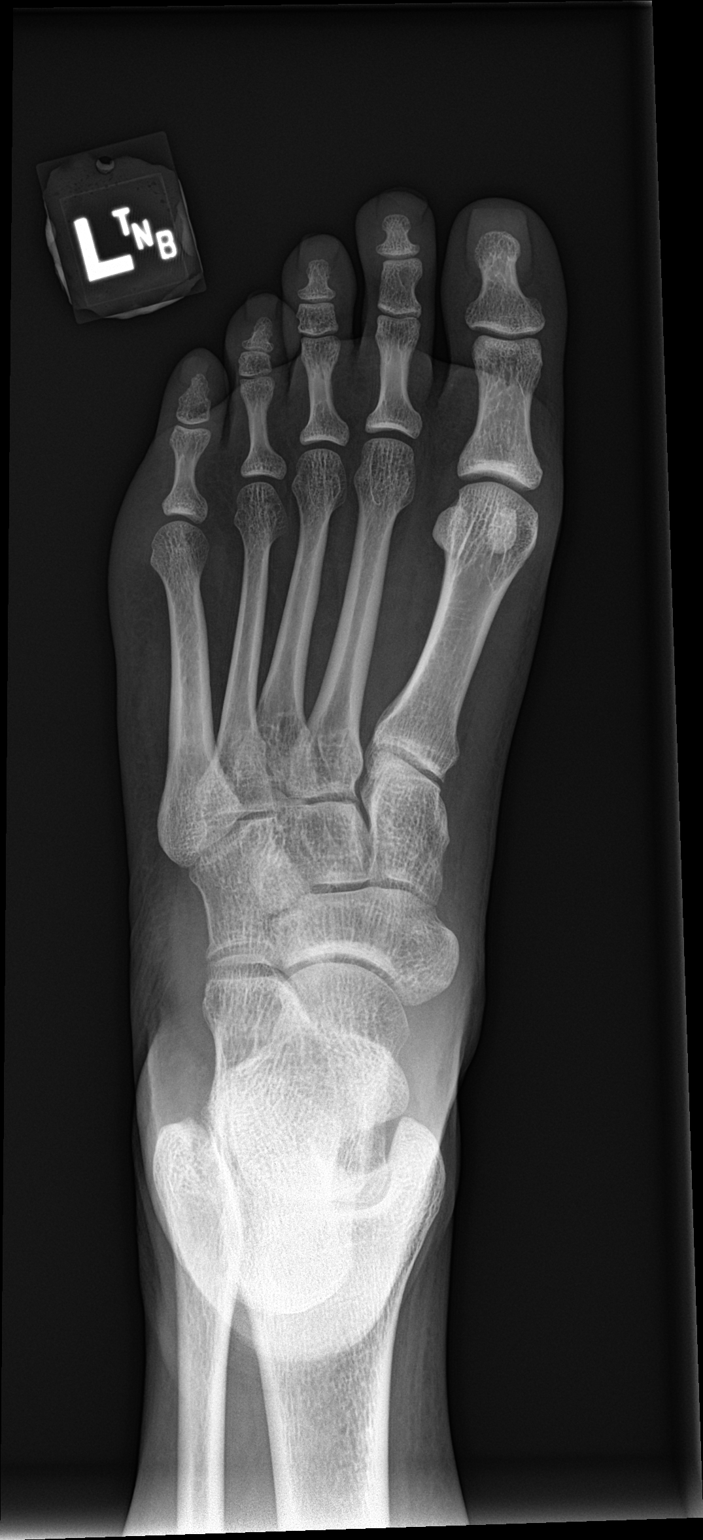

[foot obl]
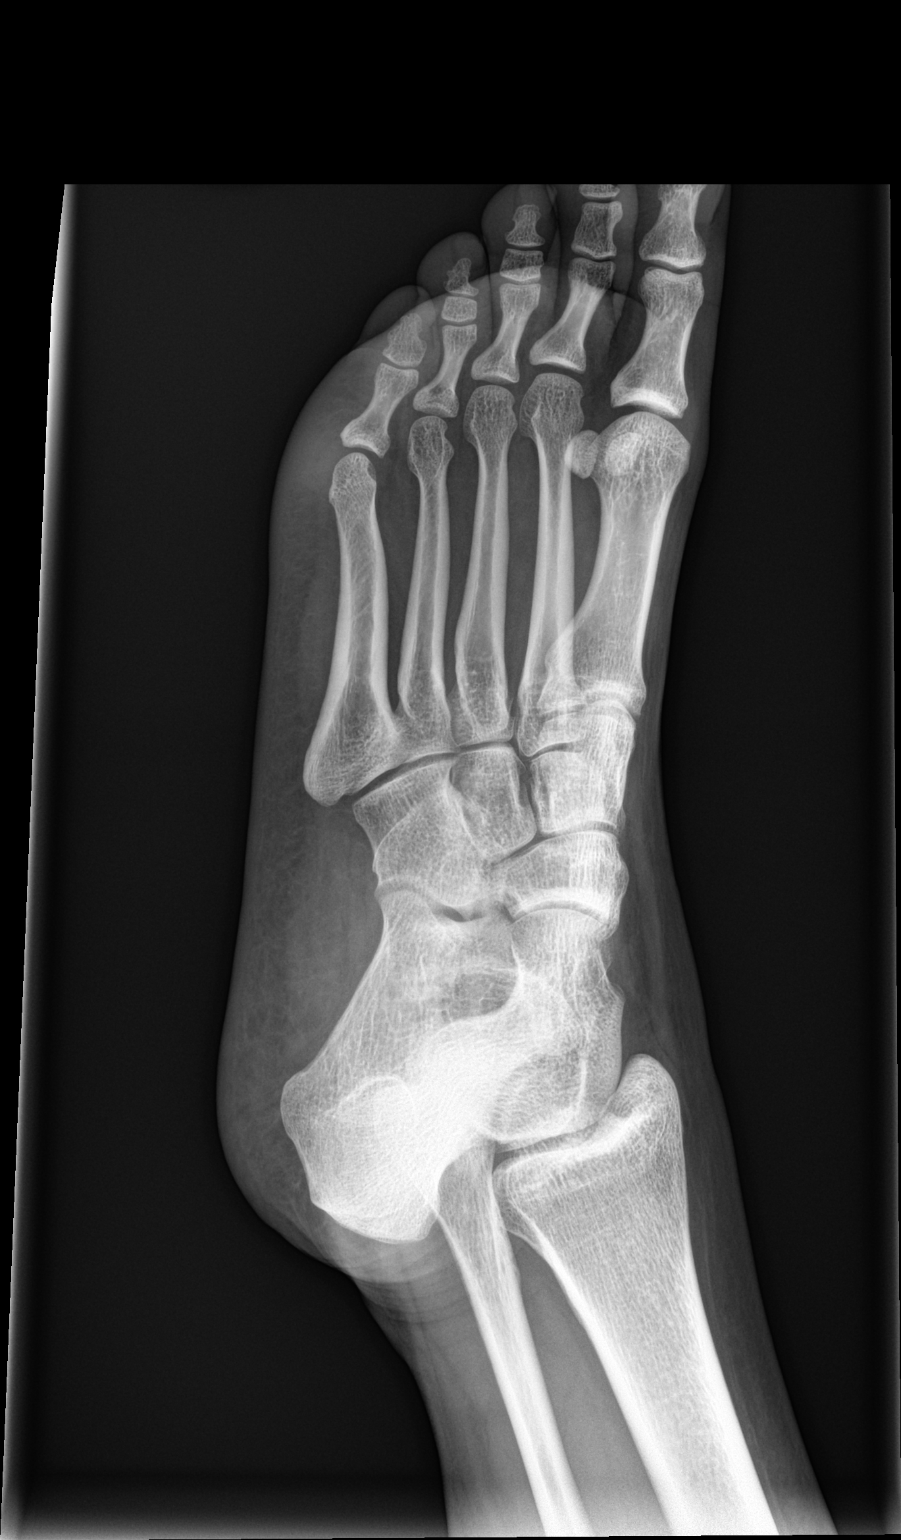

[foot lat]
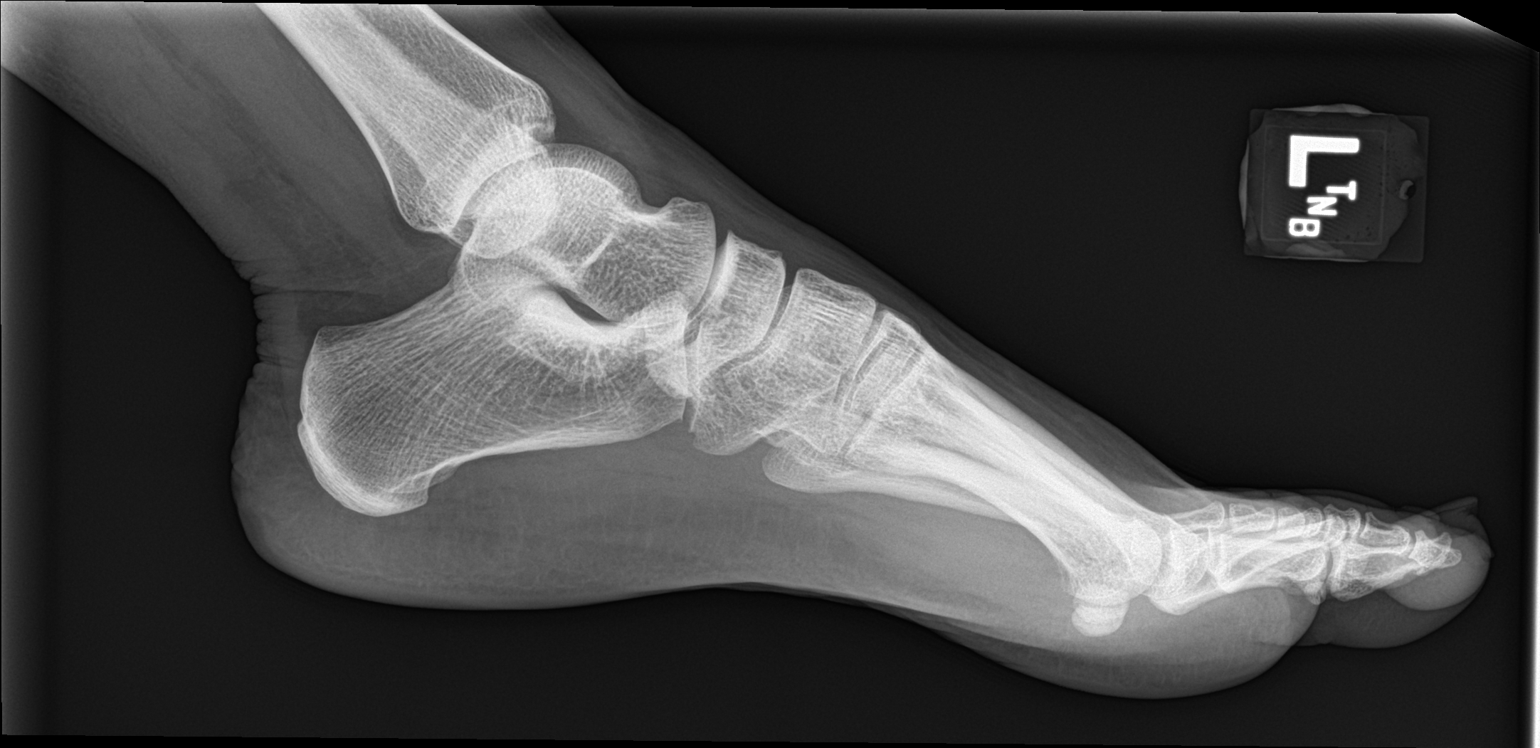

[3 of 3 positions shown; findings below may reference images not displayed]

FINDINGS: There is no evidence of fracture or dislocation. There is no
evidence of arthropathy or other focal bone abnormality. Soft
tissues are unremarkable.
IMPRESSION: Negative.

## 2023-11-16 DIAGNOSIS — F419 Anxiety disorder, unspecified: Secondary | ICD-10-CM | POA: Diagnosis not present

## 2023-11-16 DIAGNOSIS — U071 COVID-19: Secondary | ICD-10-CM | POA: Diagnosis not present

## 2023-11-21 DIAGNOSIS — U071 COVID-19: Secondary | ICD-10-CM | POA: Diagnosis not present

## 2024-02-16 DIAGNOSIS — N898 Other specified noninflammatory disorders of vagina: Secondary | ICD-10-CM | POA: Diagnosis not present

## 2024-02-16 DIAGNOSIS — R350 Frequency of micturition: Secondary | ICD-10-CM | POA: Diagnosis not present

## 2024-02-16 DIAGNOSIS — N921 Excessive and frequent menstruation with irregular cycle: Secondary | ICD-10-CM | POA: Diagnosis not present
